# Patient Record
Sex: Male | Born: 1985 | Race: White | Hispanic: No | Marital: Single | Smoking: Current every day smoker
Health system: Southern US, Community
[De-identification: ages and names within clinical notes are randomized; demographics above are authoritative.]

## PROBLEM LIST (undated history)

## (undated) DIAGNOSIS — F99 Mental disorder, not otherwise specified: Secondary | ICD-10-CM

## (undated) DIAGNOSIS — F329 Major depressive disorder, single episode, unspecified: Secondary | ICD-10-CM

## (undated) DIAGNOSIS — F419 Anxiety disorder, unspecified: Secondary | ICD-10-CM

## (undated) DIAGNOSIS — F1910 Other psychoactive substance abuse, uncomplicated: Secondary | ICD-10-CM

## (undated) DIAGNOSIS — F32A Depression, unspecified: Secondary | ICD-10-CM

## (undated) HISTORY — PX: CHOLECYSTECTOMY: SHX55

## (undated) HISTORY — DX: Other psychoactive substance abuse, uncomplicated: F19.10

---

## 2007-05-06 ENCOUNTER — Emergency Department (HOSPITAL_COMMUNITY): Admission: EM | Admit: 2007-05-06 | Discharge: 2007-05-06 | Payer: Self-pay | Admitting: Emergency Medicine

## 2007-07-26 ENCOUNTER — Emergency Department (HOSPITAL_COMMUNITY): Admission: EM | Admit: 2007-07-26 | Discharge: 2007-07-26 | Payer: Self-pay | Admitting: Emergency Medicine

## 2009-06-15 IMAGING — CR DG SHOULDER 2+V*R*
3 series · 3 of 3 positions shown · non-contrast
Comparison: None

CLINICAL DATA: Injured playing softball 2 months ago with pain

RIGHT SHOULDER - 2+ VIEW

[view not recorded (1 of 3)]
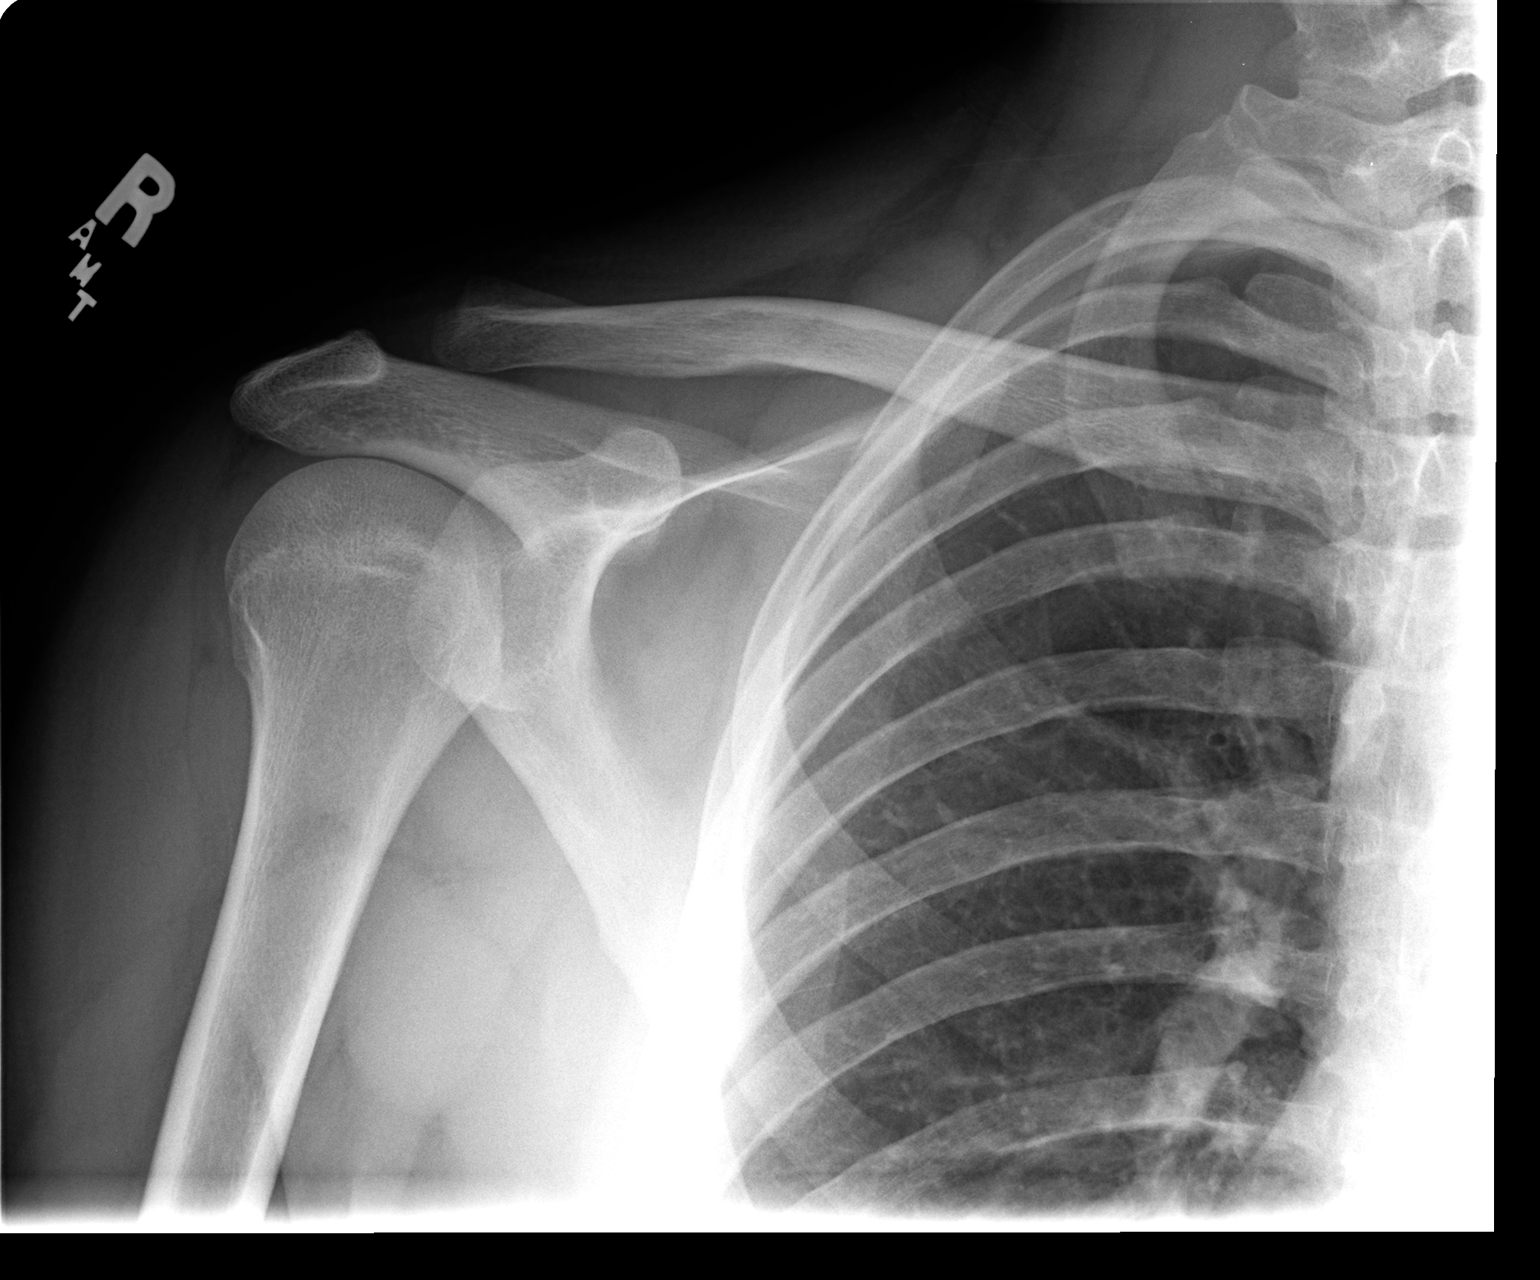

[view not recorded (2 of 3)]
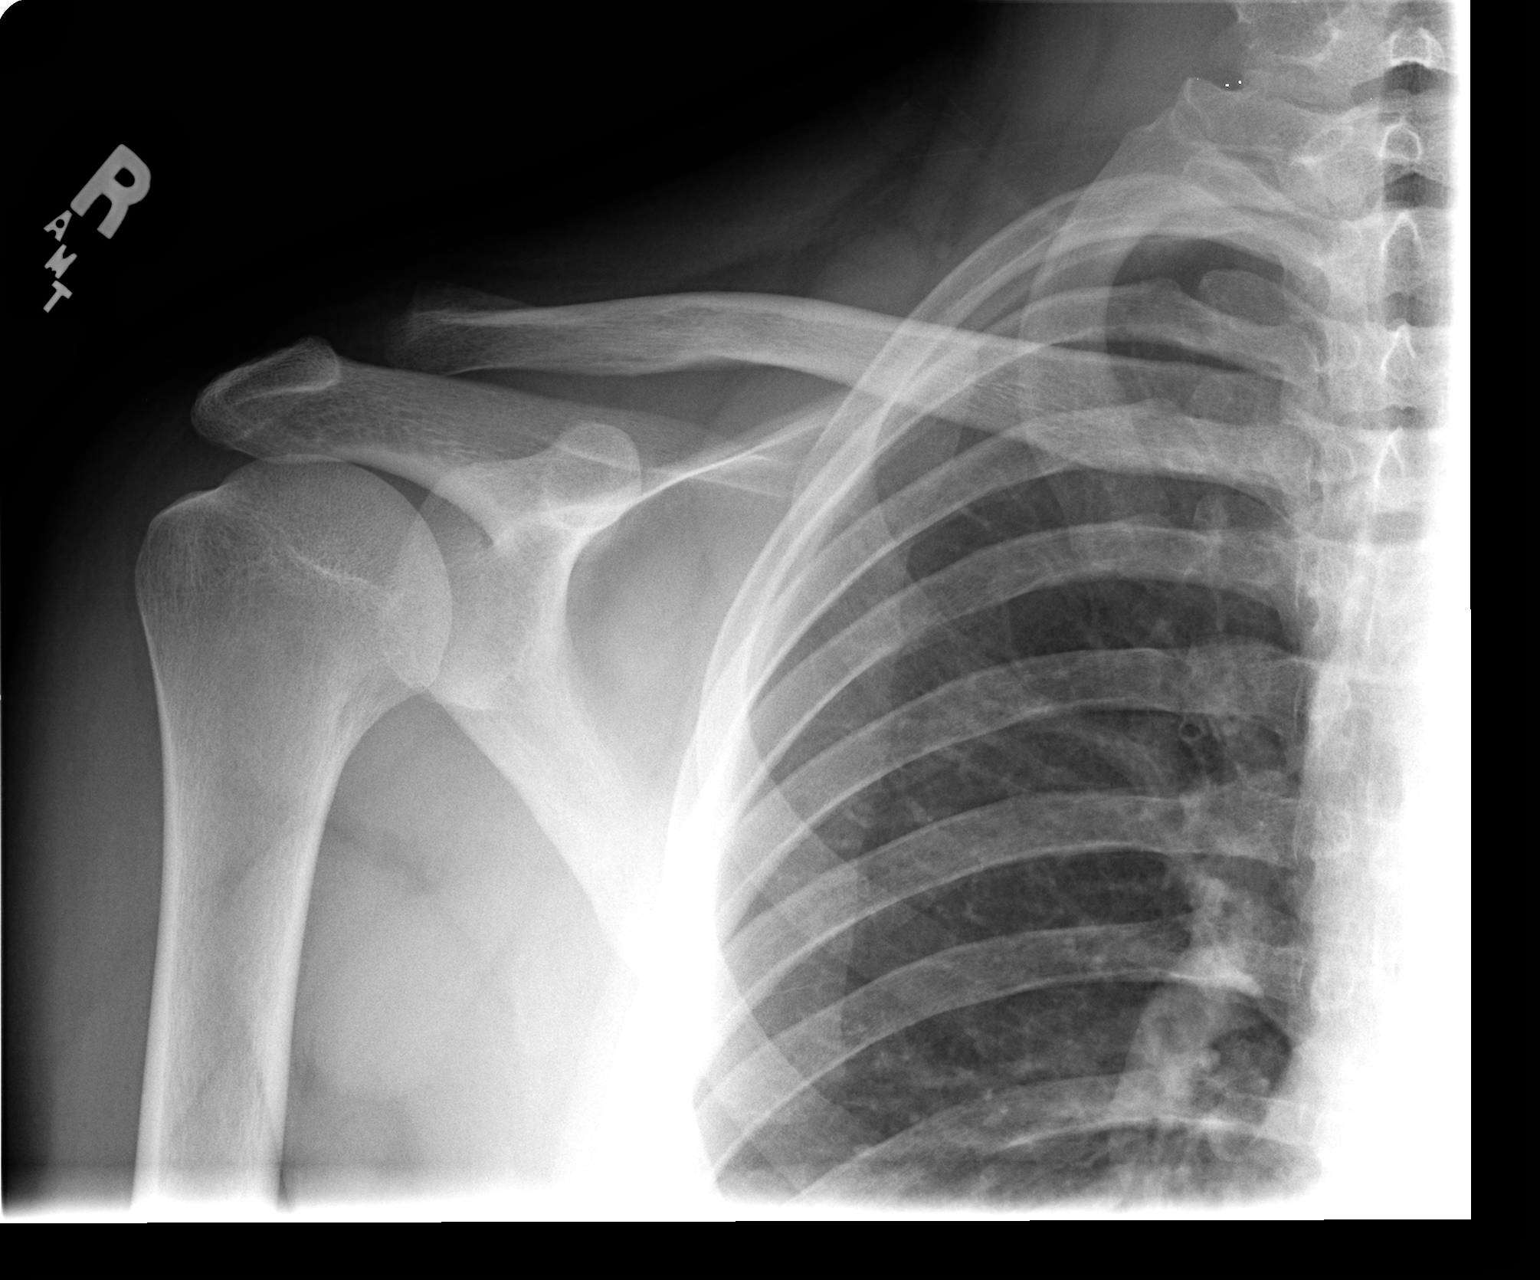

[view not recorded (3 of 3)]
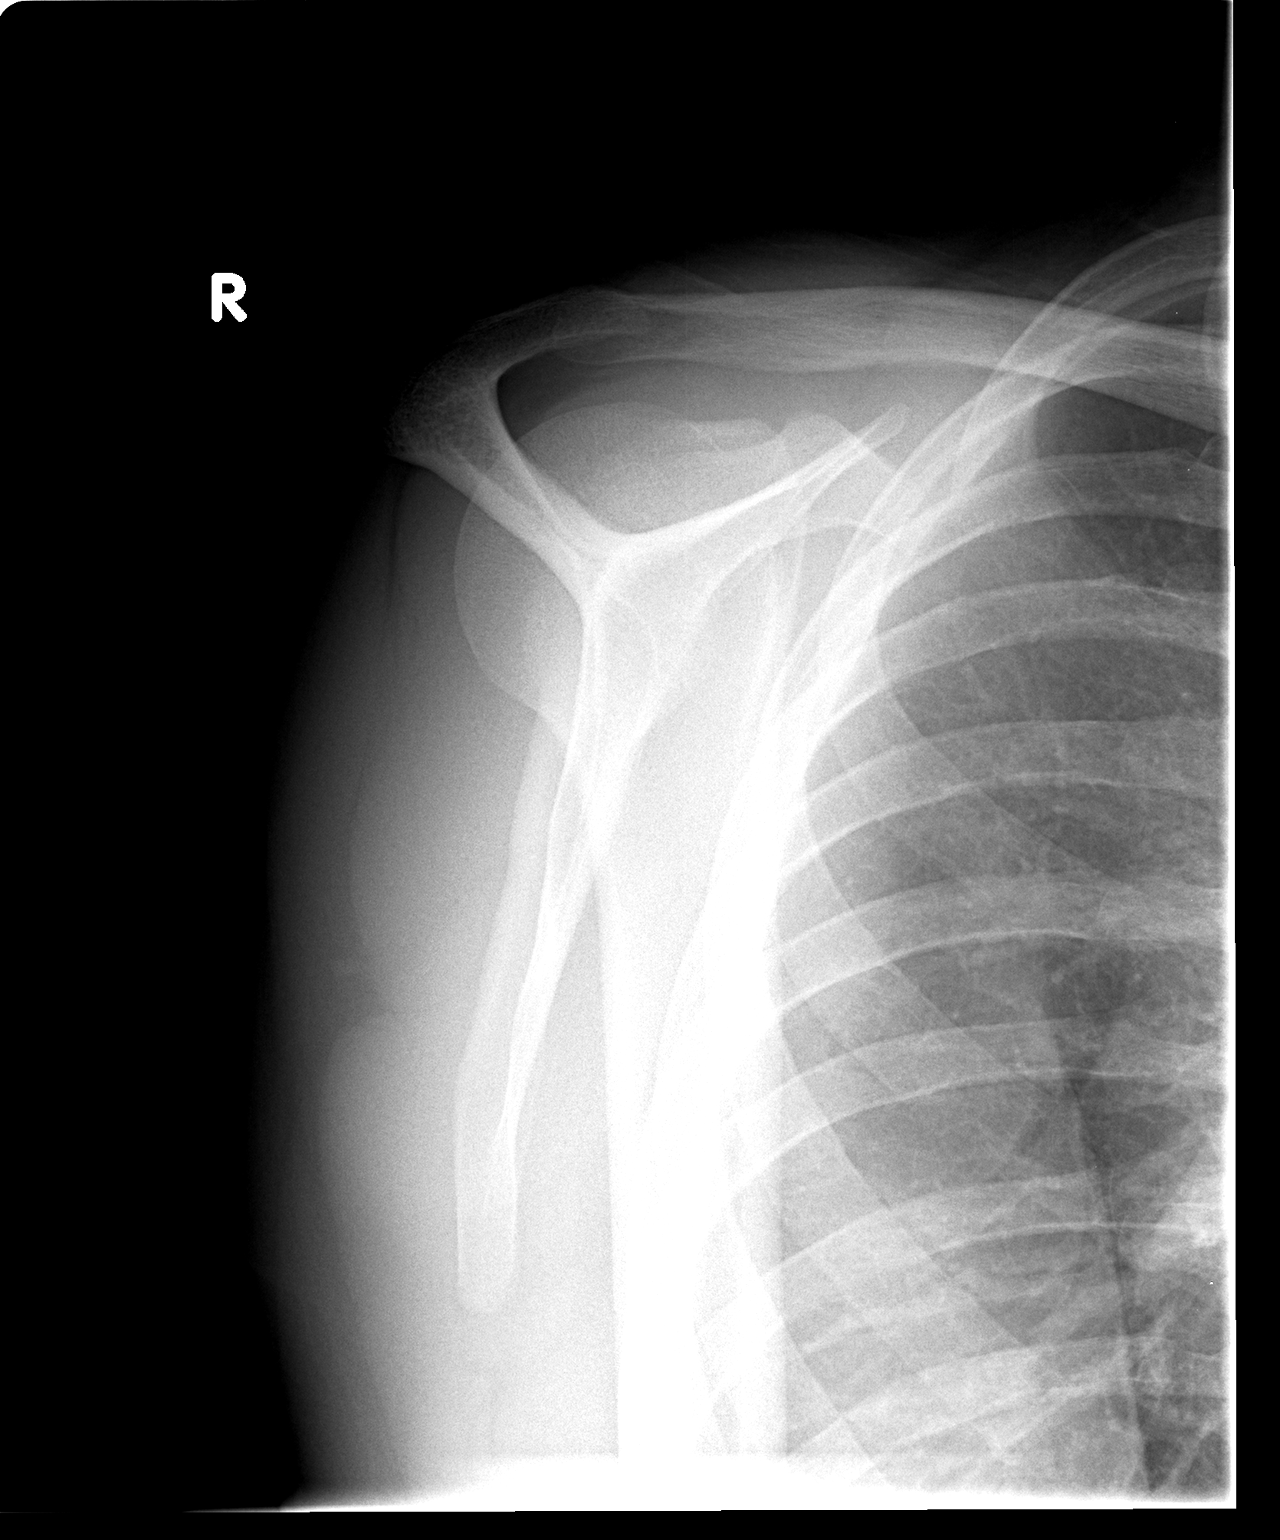

[3 of 3 positions shown; findings below may reference images not displayed]

FINDINGS: The glenohumeral joint space appears normal.  The AC
joint is somewhat widened and AC joint separation cannot be
excluded but alignment is normal.  The ribs appear intact.
IMPRESSION: No acute bony abnormality.  Somewhat widened AC joint.  Cannot
exclude mild AC joint injury.

## 2011-08-01 ENCOUNTER — Emergency Department (HOSPITAL_COMMUNITY)
Admission: EM | Admit: 2011-08-01 | Discharge: 2011-08-01 | Disposition: A | Discharge status: Home / Self Care | Payer: Self-pay | Attending: Emergency Medicine | Admitting: Emergency Medicine

## 2011-08-01 ENCOUNTER — Encounter (HOSPITAL_COMMUNITY): Payer: Self-pay | Admitting: Emergency Medicine

## 2011-08-01 DIAGNOSIS — K029 Dental caries, unspecified: Secondary | ICD-10-CM | POA: Insufficient documentation

## 2011-08-01 DIAGNOSIS — K089 Disorder of teeth and supporting structures, unspecified: Secondary | ICD-10-CM | POA: Insufficient documentation

## 2011-08-01 DIAGNOSIS — K0889 Other specified disorders of teeth and supporting structures: Secondary | ICD-10-CM

## 2011-08-01 MED ORDER — HYDROCODONE-ACETAMINOPHEN 5-325 MG PO TABS: 1.0000 | ORAL_TABLET | Freq: Four times a day (QID) | ORAL | Status: AC | PRN

## 2011-08-01 MED ORDER — PENICILLIN V POTASSIUM 500 MG PO TABS: 500.0000 mg | ORAL_TABLET | Freq: Four times a day (QID) | ORAL | Status: AC

## 2011-08-01 NOTE — Discharge Instructions (Signed)
Dental Pain A tooth ache may be caused by cavities (tooth decay). Cavities expose the nerve of the tooth to air and hot or cold temperatures. It may come from an infection or abscess (also called a boil or furuncle) around your tooth. It is also often caused by dental caries (tooth decay). This causes the pain you are having. DIAGNOSIS  Your caregiver can diagnose this problem by exam. TREATMENT   If caused by an infection, it may be treated with medications which kill germs (antibiotics) and pain medications as prescribed by your caregiver. Take medications as directed.   Only take over-the-counter or prescription medicines for pain, discomfort, or fever as directed by your caregiver.   Whether the tooth ache today is caused by infection or dental disease, you should see your dentist as soon as possible for further care.  SEEK MEDICAL CARE IF: The exam and treatment you received today has been provided on an emergency basis only. This is not a substitute for complete medical or dental care. If your problem worsens or new problems (symptoms) appear, and you are unable to meet with your dentist, call or return to this location. SEEK IMMEDIATE MEDICAL CARE IF:   You have a fever.   You develop redness and swelling of your face, jaw, or neck.   You are unable to open your mouth.   You have severe pain uncontrolled by pain medicine.  MAKE SURE YOU:   Understand these instructions.   Will watch your condition.   Will get help right away if you are not doing well or get worse.  Document Released: 02/28/2005 Document Revised: 02/17/2011 Document Reviewed: 10/17/2007 Exit[employer] Patient Information 2012 [employer].   Take the meds as directed.  Follow up with the dentist of your choice ASAP 

## 2011-08-01 NOTE — ED Provider Notes (Signed)
History     CSN: [id number]  Arrival date & time 08/01/11  1119   First MD Initiated Contact with Patient 08/01/11 1201      Chief Complaint  Patient presents with  . Dental Pain    (Consider location/radiation/quality/duration/timing/severity/associated sxs/prior treatment) HPI Comments: [facility] and was told he could not be seen for ~ 4 months.  Patient is a 26 y.o. male presenting with tooth pain. The history is provided by the patient. No language interpreter was used.  Dental PainThe primary symptoms include mouth pain. Primary symptoms do not include dental injury or fever. Episode onset: several days ago. The symptoms are worsening. The symptoms occur constantly.  Additional symptoms include: jaw pain. Additional symptoms do not include: swollen glands.    History reviewed. No pertinent past medical history.  History reviewed. No pertinent past surgical history.  History reviewed. No pertinent family history.  History  Substance Use Topics  . Smoking status: Current Everyday Smoker  . Smokeless tobacco: Not on file  . Alcohol Use: No      Review of Systems  Constitutional: Negative for fever.  HENT: Positive for dental problem.   Cardiovascular: Negative for chest pain.  All other systems reviewed and are negative.    Allergies  Review of patient's allergies indicates no known allergies.  Home Medications  No current outpatient prescriptions on file.  BP 146/81  Pulse 85  Temp(Src) 98.3 F (36.8 C) (Oral)  Resp 17  SpO2 100%  Physical Exam  Nursing note and vitals reviewed. Constitutional: He is oriented to person, place, and time. He appears well-developed and well-nourished.  HENT:  Head: Normocephalic and atraumatic.  Mouth/Throat: Uvula is midline, oropharynx is clear and moist and mucous membranes are normal. Dental caries present. No uvula swelling.    Eyes: EOM are normal.  Neck: Normal range of motion.    Cardiovascular: Normal rate, regular rhythm, normal heart sounds and intact distal pulses.   Pulmonary/Chest: Effort normal and breath sounds normal. No respiratory distress.  Abdominal: Soft. He exhibits no distension. There is no tenderness.  Musculoskeletal: Normal range of motion.  Neurological: He is alert and oriented to person, place, and time.  Skin: Skin is warm and dry.  Psychiatric: He has a normal mood and affect. Judgment normal.    ED Course  Procedures (including critical care time)  Labs Reviewed - No data to display No results found.   1. Pain, dental       MDM  rx pen VK 500 mg QID, 40 rx hydrocodone, 20 OTC ibuprofen  800 mg TID with food.   F/u with dentist of choice.        Worthy Rancher, PA 08/01/11 1224

## 2011-08-01 NOTE — ED Notes (Signed)
Pt c/o pain to L lower jaw. States feels like it is abscessed. No swelling noted.

## 2011-08-03 NOTE — ED Provider Notes (Signed)
Medical screening examination/treatment/procedure(s) were performed by non-physician practitioner and as supervising physician I was immediately available for consultation/collaboration.  Jubal Rademaker, MD 08/03/11 0910 

## 2012-04-15 ENCOUNTER — Emergency Department (HOSPITAL_COMMUNITY)
Admission: EM | Admit: 2012-04-15 | Discharge: 2012-04-15 | Disposition: A | Discharge status: Home / Self Care | Payer: Self-pay | Attending: Emergency Medicine | Admitting: Emergency Medicine

## 2012-04-15 ENCOUNTER — Encounter (HOSPITAL_COMMUNITY): Payer: Self-pay | Admitting: Emergency Medicine

## 2012-04-15 DIAGNOSIS — K0889 Other specified disorders of teeth and supporting structures: Secondary | ICD-10-CM

## 2012-04-15 DIAGNOSIS — Y939 Activity, unspecified: Secondary | ICD-10-CM | POA: Insufficient documentation

## 2012-04-15 DIAGNOSIS — Y929 Unspecified place or not applicable: Secondary | ICD-10-CM | POA: Insufficient documentation

## 2012-04-15 DIAGNOSIS — K089 Disorder of teeth and supporting structures, unspecified: Secondary | ICD-10-CM | POA: Insufficient documentation

## 2012-04-15 DIAGNOSIS — X58XXXA Exposure to other specified factors, initial encounter: Secondary | ICD-10-CM | POA: Insufficient documentation

## 2012-04-15 DIAGNOSIS — F17200 Nicotine dependence, unspecified, uncomplicated: Secondary | ICD-10-CM | POA: Insufficient documentation

## 2012-04-15 DIAGNOSIS — S025XXA Fracture of tooth (traumatic), initial encounter for closed fracture: Secondary | ICD-10-CM | POA: Insufficient documentation

## 2012-04-15 DIAGNOSIS — R51 Headache: Secondary | ICD-10-CM | POA: Insufficient documentation

## 2012-04-15 MED ORDER — PENICILLIN V POTASSIUM 500 MG PO TABS: 500.0000 mg | ORAL_TABLET | Freq: Four times a day (QID) | ORAL | Status: AC

## 2012-04-15 MED ORDER — OXYCODONE-ACETAMINOPHEN 5-325 MG PO TABS: 2.0000 | ORAL_TABLET | ORAL | Status: DC | PRN

## 2012-04-15 MED ORDER — OXYCODONE-ACETAMINOPHEN 5-325 MG PO TABS
2.0000 | ORAL_TABLET | Freq: Once | ORAL | Status: AC
  Administered 2012-04-15: 2 via ORAL
  Filled 2012-04-15: qty 2

## 2012-04-15 NOTE — ED Provider Notes (Signed)
History  Scribed for Glynn Octave, MD, the patient was seen in room APA03/APA03. This chart was scribed by Candelaria Stagers. The patient's care started at 3:30 PM   CSN: [id number]  Arrival date & time 04/15/12  1519   First MD Initiated Contact with Patient 04/15/12 1527      Chief Complaint  Patient presents with  . Dental Pain     The history is provided by the patient. No language interpreter was used.   Luke Dennis is a 27 y.o. male who presents to the Emergency Department complaining of lower left dental pain after he reports a piece of the tooth broke off yesterday.  He reports he also has a wisdom tooth coming in that is causing pain.  He denies fever.  He is also experiencing a headache.  He has no other health problems.  Pt request a note for work.     History reviewed. No pertinent past medical history.  History reviewed. No pertinent past surgical history.  History reviewed. No pertinent family history.  History  Substance Use Topics  . Smoking status: Current Every Day Smoker    Types: Cigarettes  . Smokeless tobacco: Not on file  . Alcohol Use: No      Review of Systems  Constitutional: Negative for fever and chills.  HENT: Positive for dental problem (left lower dental pain).   Respiratory: Negative for shortness of breath.   Gastrointestinal: Negative for nausea and vomiting.  Neurological: Positive for headaches. Negative for weakness.  All other systems reviewed and are negative.  A complete 10 system review of systems was obtained and all systems are negative except as noted in the HPI and PMH.    Allergies  Haldol and Hydrocodone  Home Medications  No current outpatient prescriptions on file.  BP 143/96  Pulse 89  Temp 97.9 F (36.6 C) (Oral)  Resp 24  Ht 6' (1.829 m)  Wt 220 lb (99.791 kg)  BMI 29.84 kg/m2  SpO2 100%  Physical Exam  Nursing note and vitals reviewed. Constitutional: He is oriented to person, place, and time. He  appears well-developed and well-nourished. No distress.  HENT:  Head: Normocephalic and atraumatic.       Left lower molar chipped.  Floor of mouth soft, no trismus, no tongue elevation, no abscess.  Erupting right upper molar.  Eyes: EOM are normal.  Neck: Neck supple. No tracheal deviation present.  Cardiovascular: Normal rate.   Pulmonary/Chest: Effort normal. No respiratory distress.  Musculoskeletal: Normal range of motion.  Neurological: He is alert and oriented to person, place, and time.  Skin: Skin is warm and dry.  Psychiatric: He has a normal mood and affect. His behavior is normal.    ED Course  Procedures  DIAGNOSTIC STUDIES: Oxygen Saturation is 100% on room air, normal by my interpretation.    COORDINATION OF CARE:  3:45PM Ordered: oxycodone-acetaminophen (PERCOCET/ROXICET) 5-325 MG per tablet 2 tablet   Labs Reviewed - No data to display No results found.   No diagnosis found.    MDM  Left lower molar dental pain fractured tooth. No evidence of abscess or Ludwig angina.  No difficulty breathing or swallowing. Pain control, followup with dentistry. [place] narcotic database reviewed   I personally performed the services described in this documentation, which was scribed in my presence. The recorded information has been reviewed and is accurate.        Glynn Octave, MD 04/15/12 2232

## 2012-04-15 NOTE — ED Notes (Signed)
Pt c/o lower left toothache. 

## 2012-04-22 ENCOUNTER — Encounter (HOSPITAL_COMMUNITY): Payer: Self-pay

## 2012-04-22 ENCOUNTER — Emergency Department (HOSPITAL_COMMUNITY)
Admission: EM | Admit: 2012-04-22 | Discharge: 2012-04-22 | Disposition: A | Discharge status: Home / Self Care | Payer: Self-pay | Attending: Emergency Medicine | Admitting: Emergency Medicine

## 2012-04-22 DIAGNOSIS — K089 Disorder of teeth and supporting structures, unspecified: Secondary | ICD-10-CM | POA: Insufficient documentation

## 2012-04-22 DIAGNOSIS — F17200 Nicotine dependence, unspecified, uncomplicated: Secondary | ICD-10-CM | POA: Insufficient documentation

## 2012-04-22 DIAGNOSIS — K029 Dental caries, unspecified: Secondary | ICD-10-CM | POA: Insufficient documentation

## 2012-04-22 DIAGNOSIS — K0889 Other specified disorders of teeth and supporting structures: Secondary | ICD-10-CM

## 2012-04-22 MED ORDER — TRAMADOL HCL 50 MG PO TABS: 50.0000 mg | ORAL_TABLET | Freq: Four times a day (QID) | ORAL | Status: DC | PRN

## 2012-04-22 MED ORDER — NAPROXEN 250 MG PO TABS: 250.0000 mg | ORAL_TABLET | Freq: Two times a day (BID) | ORAL | Status: DC

## 2012-04-22 NOTE — ED Notes (Signed)
Pt with multiple toothaches.

## 2012-04-22 NOTE — ED Provider Notes (Signed)
History     CSN: [id number]  Arrival date & time 04/22/12  1343   First MD Initiated Contact with Patient 04/22/12 1402      Chief Complaint  Patient presents with  . Dental Pain     HPI Pt was seen at 1415.  Per pt, c/o gradual onset and persistence of constant left lower tooth and right upper molar "pain" for the past several months.  Denies fevers, no intra-oral edema, no rash, no facial swelling, no dysphagia, no neck pain.   The condition is aggravated by nothing. The condition is relieved by nothing. The symptoms have been associated with no other complaints. The patient has no significant history of serious medical conditions. The patient has a significant history of similar symptoms previously, recently being evaluated for this complaint and multiple prior evals for same.       History reviewed. No pertinent past medical history.  History reviewed. No pertinent past surgical history.   History  Substance Use Topics  . Smoking status: Current Every Day Smoker    Types: Cigarettes  . Smokeless tobacco: Not on file  . Alcohol Use: No      Review of Systems ROS: Statement: All systems negative except as marked or noted in the HPI; Constitutional: Negative for fever and chills. ; ; Eyes: Negative for eye pain and discharge. ; ; ENMT: Positive for dental caries, dental hygiene poor and toothache. Negative for ear pain, bleeding gums, dental injury, facial deformity, facial swelling, hoarseness, nasal congestion, sinus pressure, sore throat, throat swelling and tongue swollen. ; ; Cardiovascular: Negative for chest pain, palpitations, diaphoresis, dyspnea and peripheral edema. ; ; Respiratory: Negative for cough, wheezing and stridor. ; ; Gastrointestinal: Negative for nausea, vomiting, diarrhea and abdominal pain. ; ; Genitourinary: Negative for dysuria, flank pain and hematuria. ; ; Musculoskeletal: Negative for back pain and neck pain. ; ; Skin: Negative for rash and skin  lesion. ; ; Neuro: Negative for headache, lightheadedness and neck stiffness. ;    Allergies  Haldol and Hydrocodone  Home Medications   Current Outpatient Rx  Name  Route  Sig  Dispense  Refill  . oxyCODONE-acetaminophen (PERCOCET/ROXICET) 5-325 MG per tablet   Oral   Take 2 tablets by mouth every 4 (four) hours as needed for pain.   15 tablet   0   . penicillin v potassium (VEETID) 500 MG tablet   Oral   Take 1 tablet (500 mg total) by mouth 4 (four) times daily.   40 tablet   0   . naproxen (NAPROSYN) 250 MG tablet   Oral   Take 1 tablet (250 mg total) by mouth 2 (two) times daily with a meal.   14 tablet   0   . traMADol (ULTRAM) 50 MG tablet   Oral   Take 1 tablet (50 mg total) by mouth every 6 (six) hours as needed for pain.   15 tablet   0     BP 131/83  Pulse 100  Temp(Src) 97.9 F (36.6 C) (Oral)  Resp 20  SpO2 100%  Physical Exam 1420: Physical examination: Vital signs and O2 SAT: Reviewed; Constitutional: Well developed, Well nourished, Well hydrated, In no acute distress; Head and Face: Normocephalic, Atraumatic; Eyes: EOMI, PERRL, No scleral icterus; ENMT: Mouth and pharynx normal, Poor dentition, Widespread dental decay, Left TM normal, Right TM normal, Mucous membranes moist, +upper right wisdom tooth erupting, left lower molar with dental decay.  No gingival erythema, edema, fluctuance, or  drainage.  No intra-oral edema. No hoarse voice, no drooling, no stridor.  ; Neck: Supple, Full range of motion, No lymphadenopathy; Cardiovascular: Regular rate and rhythm, No murmur, rub, or gallop; Respiratory: Breath sounds clear & equal bilaterally, No rales, rhonchi, wheezes, or rub, Normal respiratory effort/excursion; Chest: Nontender, Movement normal; Extremities: Pulses normal, No tenderness, No edema; Neuro: AA&Ox3, Major CN grossly intact.  No gross focal motor or sensory deficits in extremities.; Skin: Color normal, No rash, No petechiae, Warm, Dry   ED  Course  Procedures     MDM  MDM Reviewed: nursing note, vitals and previous chart     1430:  Pt seen in ED 1 week ago for same, has not called a dentist for f/u.  Will not rx more narcotic pain meds, as pt has been eval in the ED 3 times for the same complaints (same teeth) over the past 9 months.  Pt encouraged to f/u with dentist or oral surgeon for his dental needs for good continuity of care and definitive treatment.  Verb understanding.        Laray Anger, DO 04/23/12 2109

## 2012-07-04 NOTE — BH Assessment (Signed)
Assessment Note   Luke Dennis is an 27 y.o. male presented to [facility] requesting detox for opioids and benzodiazepines.  Pt made threats in the ED, "if you send me home, I will kill myself.  Pt had no specific plan.  However, over the course of 3 years, he has had 6 SI attempts.  Pt reports abusing marijuana and cocaine on a daily basis.  Pt. current triggers to his usage and Suicidal ideations are due to some social struggles as as evidence by  job lost and wife is currently pregnant.  Pt denies HI and A/V hallucinations.  Pt endorses that he has been using opioids, cocaine, benzodiazepines for 4 months and marijuana for the past two years.  Pt admits that this longest period of sobriety is 8 months. Pt also reports an increase in signs and symptoms of depression to include:  Hopelessness, crying spells and mood swings, fatigue, decrease concentration, social withdrawal and feelings of guilt.  While in the ED([facility]) @12 :22 his CIWA was 1 and his COW was at a 5.    He has a supportive wife and mother.   Axis I: Opioid and Benzodiazepine dependence, Cocaine and Cannibis Abuse, Mood Disorder NOS Axis II: Deferred Axis III: No past medical history on file. Axis IV: economic problems, other psychosocial or environmental problems, problems related to legal system/crime and problems related to social environment Axis V: 31-40 impairment in reality testing  Past Medical History: No past medical history on file.  No past surgical history on file.  Family History: No family history on file.  Social History:  reports that he has been smoking Cigarettes.  He has been smoking about 0.00 packs per day. He does not have any smokeless tobacco history on file. He reports that he does not drink alcohol or use illicit drugs.  Additional Social History:  Alcohol / Drug Use History of alcohol / drug use?: Yes Substance #1 Name of Substance 1: cocaine/Crack 1 - Age of First Use: 22 1 - Amount  (size/oz): <1gm 1 - Frequency: 2-3 times per week 1 - Duration: 4 months 1 - Last Use / Amount: 07/02/2012 Substance #2 Name of Substance 2: opiates(Vicodin, percocet and opana) 2 - Age of First Use: 22 2 - Frequency: weekly 2 - Duration: 4 months 2 - Last Use / Amount: 07/04/2012 Substance #3 Name of Substance 3: Benzodiazepines 3 - Age of First Use: 22 3 - Amount (size/oz): up to 30 mg 3 - Frequency: daily 3 - Duration: 4 months 3 - Last Use / Amount: 07/04/2012 Substance #4 Name of Substance 4: marijuana 4 - Age of First Use: teenager 4 - Amount (size/oz): 3-4 joints 4 - Frequency: daily 4 - Duration: 2 years 4 - Last Use / Amount: 07/04/2012  CIWA: CIWA-Ar BP: 106/53 mmHg Pulse Rate: 85 Nausea and Vomiting: mild nausea with no vomiting Tactile Disturbances: none Tremor: no tremor Auditory Disturbances: not present Paroxysmal Sweats: no sweat visible Visual Disturbances: not present Anxiety: no anxiety, at ease Headache, Fullness in Head: none present Agitation: normal activity Orientation and Clouding of Sensorium: oriented and can do serial additions CIWA-Ar Total: 1 COWS: Clinical Opiate Withdrawal Scale (COWS) Resting Pulse Rate: Pulse Rate 81-100 Sweating: No report of chills or flushing Restlessness: Able to sit still Pupil Size: Pupils possibly larger than normal for room light Bone or Joint Aches: Mild diffuse discomfort Runny Nose or Tearing: Not present GI Upset: nausea or loose stool Tremor: No tremor Yawning: No yawning  Anxiety or Irritability: None Gooseflesh Skin: Skin is smooth COWS Total Score: 5  Allergies:  Allergies  Allergen Reactions  . Haldol (Haloperidol Lactate) Other (See Comments)    Child hood allergy  . Hydrocodone Nausea And Vomiting    Home Medications:  (Not in a hospital admission)  OB/GYN Status:  No LMP for male patient.  General Assessment Data Location of Assessment: [facility] Living Arrangements:  Spouse/significant other Can pt return to current living arrangement?: Yes Admission Status: Involuntary Is patient capable of signing voluntary admission?: No Transfer from: Acute Hospital Referral Source: Psychiatrist     Risk to self Suicidal Ideation: Yes-Currently Present Suicidal Intent: Yes-Currently Present Is patient at risk for suicide?: Yes Suicidal Plan?: No-Not Currently/Within Last 6 Months Access to Means: No What has been your use of drugs/alcohol within the last 12 months?: opioid, benzodiazepine, cannibis and cocaine Previous Attempts/Gestures: Yes How many times?:  (6 times, 2010(2 times), 2011(2 times) and 2012(2 times)) Other Self Harm Risks: no Triggers for Past Attempts: Unknown Family Suicide History: No Recent stressful life event(s): Job Loss;Financial Problems;Legal Issues;Other (Comment) (wife is pregnant) Persecutory voices/beliefs?: No Depression: Yes Depression Symptoms: Insomnia;Tearfulness;Isolating;Fatigue;Guilt;Loss of interest in usual pleasures;Feeling worthless/self pity;Feeling angry/irritable Substance abuse history and/or treatment for substance abuse?: Yes Suicide prevention information given to non-admitted patients: Not applicable  Risk to Others Homicidal Ideation: No Thoughts of Harm to Others: No Current Homicidal Intent: No-Not Currently/Within Last 6 Months History of harm to others?: No Assessment of Violence: None Noted Criminal Charges Pending?: Yes (multiple arrest and convictions for drug related charges) Describe Pending Criminal Charges:  (no pending court date given at this ) Does patient have a court date: No  Psychosis Hallucinations: None noted Delusions: None noted  Mental Status Report Appear/Hygiene: Disheveled Eye Contact: Fair Motor Activity: Other (Comment) (decrease) Speech: Soft Level of Consciousness: Sedated;Other (Comment) (withdrawn and sedated) Mood: Irritable Affect:  (dull) Anxiety Level:  Minimal Thought Processes: Coherent;Relevant Judgement: Impaired Orientation: Person;Place;Time;Situation Obsessive Compulsive Thoughts/Behaviors: Moderate (Recurrent thoughts of death)  Cognitive Functioning Concentration: Decreased Memory: Recent Impaired IQ: Average (Poor historian) Insight: Poor Impulse Control: Poor Appetite: Fair Weight Loss:  (none reported) Weight Gain:  (none reported) Sleep: Decreased Total Hours of Sleep: 4 Vegetative Symptoms: Decreased grooming  ADLScreening [facility]) Patient's cognitive ability adequate to safely complete daily activities?: Yes Patient able to express need for assistance with ADLs?: Yes Independently performs ADLs?: Yes (appropriate for developmental age)  Abuse/Neglect [facility]) Physical Abuse: Denies Verbal Abuse: Denies Sexual Abuse: Denies  Prior Inpatient Therapy Prior Inpatient Therapy: Yes Prior Therapy Dates: December 2013, June 2013, December 2012 and October 2012 Prior Therapy Facilty/Provider(s): [place], [facility], [facility] Reason for Treatment: [facility], Chemical Dependency  Prior Outpatient Therapy Prior Outpatient Therapy: Yes Prior Therapy Dates: 2013, 2012 Prior Therapy Facilty/Provider(s): [place], [facility] Reason for Treatment: Chemical dependency  ADL Screening (condition at time of admission) Patient's cognitive ability adequate to safely complete daily activities?: Yes Patient able to express need for assistance with ADLs?: Yes Independently performs ADLs?: Yes (appropriate for developmental age)       Abuse/Neglect Assessment (Assessment to be complete while patient is alone) Physical Abuse: Denies Verbal Abuse: Denies Sexual Abuse: Denies          Additional Information 1:1 In Past 12 Months?: No CIRT Risk: No Elopement Risk: No Does patient have medical clearance?: Yes     Disposition: Pt admitted to [facility] by Fransisca Kaufmann, NP to [place], [address] Disposition Initial Assessment  Completed  for this Encounter: Yes Disposition of Patient:  (Pt.was accepted to [facility] by Vernona Rieger NP to [place], [address])  On Site Evaluation by:   Reviewed with Physician:     Tennis Must 07/04/2012 10:52 PM

## 2012-07-05 ENCOUNTER — Inpatient Hospital Stay (HOSPITAL_COMMUNITY)
Admission: AD | Admit: 2012-07-05 | Discharge: 2012-07-12 | DRG: 897 | Disposition: A | Discharge status: Home / Self Care | Payer: Federal, State, Local not specified - Other | Source: Other Acute Inpatient Hospital | Attending: Psychiatry | Admitting: Psychiatry

## 2012-07-05 ENCOUNTER — Encounter (HOSPITAL_COMMUNITY): Payer: Self-pay | Admitting: Behavioral Health

## 2012-07-05 DIAGNOSIS — R109 Unspecified abdominal pain: Secondary | ICD-10-CM

## 2012-07-05 DIAGNOSIS — F319 Bipolar disorder, unspecified: Secondary | ICD-10-CM

## 2012-07-05 DIAGNOSIS — F1920 Other psychoactive substance dependence, uncomplicated: Secondary | ICD-10-CM | POA: Diagnosis present

## 2012-07-05 DIAGNOSIS — F1994 Other psychoactive substance use, unspecified with psychoactive substance-induced mood disorder: Principal | ICD-10-CM | POA: Diagnosis present

## 2012-07-05 DIAGNOSIS — Z79899 Other long term (current) drug therapy: Secondary | ICD-10-CM

## 2012-07-05 DIAGNOSIS — F1123 Opioid dependence with withdrawal: Secondary | ICD-10-CM

## 2012-07-05 HISTORY — DX: Anxiety disorder, unspecified: F41.9

## 2012-07-05 HISTORY — DX: Mental disorder, not otherwise specified: F99

## 2012-07-05 HISTORY — DX: Major depressive disorder, single episode, unspecified: F32.9

## 2012-07-05 HISTORY — DX: Depression, unspecified: F32.A

## 2012-07-05 MED ORDER — CLONIDINE HCL 0.1 MG PO TABS
0.1000 mg | ORAL_TABLET | Freq: Every day | ORAL | Status: AC
  Administered 2012-07-09 – 2012-07-10 (×2): 0.1 mg via ORAL
  Filled 2012-07-05 (×2): qty 1

## 2012-07-05 MED ORDER — CHLORDIAZEPOXIDE HCL 25 MG PO CAPS
25.0000 mg | ORAL_CAPSULE | Freq: Four times a day (QID) | ORAL | Status: AC
  Administered 2012-07-05 (×2): 25 mg via ORAL
  Filled 2012-07-05 (×3): qty 1

## 2012-07-05 MED ORDER — CHLORDIAZEPOXIDE HCL 25 MG PO CAPS
50.0000 mg | ORAL_CAPSULE | Freq: Once | ORAL | Status: AC
  Administered 2012-07-05: 50 mg via ORAL
  Filled 2012-07-05: qty 2

## 2012-07-05 MED ORDER — CHLORDIAZEPOXIDE HCL 25 MG PO CAPS
25.0000 mg | ORAL_CAPSULE | Freq: Three times a day (TID) | ORAL | Status: AC
  Administered 2012-07-06 (×3): 25 mg via ORAL
  Filled 2012-07-05 (×3): qty 1

## 2012-07-05 MED ORDER — NICOTINE 21 MG/24HR TD PT24
21.0000 mg | MEDICATED_PATCH | Freq: Every day | TRANSDERMAL | Status: DC
  Administered 2012-07-05 – 2012-07-12 (×8): 21 mg via TRANSDERMAL
  Filled 2012-07-05 (×11): qty 1

## 2012-07-05 MED ORDER — SULFAMETHOXAZOLE-TRIMETHOPRIM 400-80 MG PO TABS
1.0000 | ORAL_TABLET | Freq: Two times a day (BID) | ORAL | Status: DC
  Administered 2012-07-05 – 2012-07-06 (×3): 1 via ORAL
  Administered 2012-07-06: 22:00:00 via ORAL
  Administered 2012-07-07 – 2012-07-12 (×10): 1 via ORAL
  Filled 2012-07-05 (×20): qty 1

## 2012-07-05 MED ORDER — ONDANSETRON 4 MG PO TBDP
4.0000 mg | ORAL_TABLET | Freq: Four times a day (QID) | ORAL | Status: AC | PRN
  Administered 2012-07-05 – 2012-07-07 (×4): 4 mg via ORAL

## 2012-07-05 MED ORDER — TRAZODONE HCL 100 MG PO TABS
100.0000 mg | ORAL_TABLET | Freq: Every evening | ORAL | Status: DC | PRN
  Administered 2012-07-05 – 2012-07-06 (×2): 100 mg via ORAL
  Filled 2012-07-05 (×2): qty 1

## 2012-07-05 MED ORDER — CHLORDIAZEPOXIDE HCL 25 MG PO CAPS
25.0000 mg | ORAL_CAPSULE | Freq: Every day | ORAL | Status: AC
  Administered 2012-07-08: 25 mg via ORAL
  Filled 2012-07-05: qty 1

## 2012-07-05 MED ORDER — LOPERAMIDE HCL 2 MG PO CAPS: 2.0000 mg | ORAL_CAPSULE | ORAL | Status: AC | PRN

## 2012-07-05 MED ORDER — CHLORDIAZEPOXIDE HCL 25 MG PO CAPS
25.0000 mg | ORAL_CAPSULE | Freq: Four times a day (QID) | ORAL | Status: AC | PRN
  Administered 2012-07-06 – 2012-07-08 (×6): 25 mg via ORAL
  Filled 2012-07-05 (×6): qty 1

## 2012-07-05 MED ORDER — HYDROXYZINE HCL 25 MG PO TABS
25.0000 mg | ORAL_TABLET | Freq: Four times a day (QID) | ORAL | Status: AC | PRN
  Administered 2012-07-05: 25 mg via ORAL

## 2012-07-05 MED ORDER — ADULT MULTIVITAMIN W/MINERALS CH
1.0000 | ORAL_TABLET | Freq: Every day | ORAL | Status: DC
  Administered 2012-07-05 – 2012-07-12 (×8): 1 via ORAL
  Filled 2012-07-05 (×10): qty 1

## 2012-07-05 MED ORDER — METHOCARBAMOL 500 MG PO TABS
500.0000 mg | ORAL_TABLET | Freq: Three times a day (TID) | ORAL | Status: DC | PRN
  Administered 2012-07-05 – 2012-07-09 (×4): 500 mg via ORAL
  Filled 2012-07-05 (×4): qty 1

## 2012-07-05 MED ORDER — HYDROXYZINE HCL 25 MG PO TABS: 25.0000 mg | ORAL_TABLET | Freq: Four times a day (QID) | ORAL | Status: DC | PRN

## 2012-07-05 MED ORDER — ONDANSETRON 4 MG PO TBDP: 4.0000 mg | ORAL_TABLET | Freq: Four times a day (QID) | ORAL | Status: DC | PRN

## 2012-07-05 MED ORDER — THIAMINE HCL 100 MG/ML IJ SOLN: 100.0000 mg | Freq: Once | INTRAMUSCULAR | Status: DC

## 2012-07-05 MED ORDER — CLONIDINE HCL 0.1 MG PO TABS
0.1000 mg | ORAL_TABLET | ORAL | Status: AC
  Administered 2012-07-07 – 2012-07-08 (×4): 0.1 mg via ORAL
  Filled 2012-07-05 (×4): qty 1

## 2012-07-05 MED ORDER — CHLORDIAZEPOXIDE HCL 25 MG PO CAPS
25.0000 mg | ORAL_CAPSULE | ORAL | Status: AC
  Administered 2012-07-07 (×2): 25 mg via ORAL
  Filled 2012-07-05 (×2): qty 1

## 2012-07-05 MED ORDER — VITAMIN B-1 100 MG PO TABS
100.0000 mg | ORAL_TABLET | Freq: Every day | ORAL | Status: DC
  Administered 2012-07-06 – 2012-07-12 (×7): 100 mg via ORAL
  Filled 2012-07-05 (×9): qty 1

## 2012-07-05 MED ORDER — DICYCLOMINE HCL 20 MG PO TABS
20.0000 mg | ORAL_TABLET | Freq: Four times a day (QID) | ORAL | Status: AC | PRN
  Administered 2012-07-05 – 2012-07-08 (×7): 20 mg via ORAL
  Filled 2012-07-05 (×7): qty 1

## 2012-07-05 MED ORDER — ALUM & MAG HYDROXIDE-SIMETH 200-200-20 MG/5ML PO SUSP: 30.0000 mL | ORAL | Status: DC | PRN

## 2012-07-05 MED ORDER — ACETAMINOPHEN 325 MG PO TABS
650.0000 mg | ORAL_TABLET | Freq: Four times a day (QID) | ORAL | Status: DC | PRN
Start: 2012-07-05 — End: 2012-07-12
  Administered 2012-07-06 – 2012-07-12 (×6): 650 mg via ORAL

## 2012-07-05 MED ORDER — LOPERAMIDE HCL 2 MG PO CAPS: 2.0000 mg | ORAL_CAPSULE | ORAL | Status: DC | PRN

## 2012-07-05 MED ORDER — MAGNESIUM HYDROXIDE 400 MG/5ML PO SUSP
30.0000 mL | Freq: Every day | ORAL | Status: DC | PRN
  Administered 2012-07-08: 30 mL via ORAL

## 2012-07-05 MED ORDER — CLONIDINE HCL 0.1 MG PO TABS
0.1000 mg | ORAL_TABLET | Freq: Four times a day (QID) | ORAL | Status: AC
  Administered 2012-07-05 – 2012-07-06 (×6): 0.1 mg via ORAL
  Filled 2012-07-05 (×7): qty 1

## 2012-07-05 MED ORDER — NAPROXEN 500 MG PO TABS
500.0000 mg | ORAL_TABLET | Freq: Two times a day (BID) | ORAL | Status: AC | PRN
  Administered 2012-07-05 – 2012-07-10 (×10): 500 mg via ORAL
  Filled 2012-07-05 (×10): qty 1

## 2012-07-05 NOTE — Tx Team (Signed)
Initial Interdisciplinary Treatment Plan  PATIENT STRENGTHS: (choose at least two) Ability for insight Capable of independent living Communication skills General fund of knowledge Motivation for treatment/growth  PATIENT STRESSORS: Financial difficulties Marital or family conflict Substance abuse   PROBLEM LIST: Problem List/Patient Goals Date to be addressed Date deferred Reason deferred Estimated date of resolution  Substance abuse      Depression      Anxiety                                           DISCHARGE CRITERIA:  Ability to meet basic life and health needs Improved stabilization in mood, thinking, and/or behavior Motivation to continue treatment in a less acute level of care  PRELIMINARY DISCHARGE PLAN: Attend aftercare/continuing care group Outpatient therapy Return to previous living arrangement  PATIENT/FAMIILY INVOLVEMENT: This treatment plan has been presented to and reviewed with the patient, Luke Dennis.  The patient and family have been given the opportunity to ask questions and make suggestions.  Harold Barban E 07/05/2012, 10:45 AM

## 2012-07-05 NOTE — Progress Notes (Signed)
[facility] LCSW Group Therapy  07/05/2012 1:15 PM  Type of Therapy:  Group Therapy 1:15 to 2:30   Participation Level:  Active, for the 15 minutes patient was in group  Participation Quality:  Attentive and Sharing  Affect:  Flat  Cognitive:  Alert and Oriented  Insight:  Limited  Engagement in Therapy:  Developing  Modes of Intervention:  Discussion, Exploration, Rapport Building, Socialization and Support  Summary of Progress/Problems: Focus of group processing discussion was on balance in life; the components in life which have a negative influence on balance and the components which make for a more balanced life.  Patient shared several negative influences in his life including: altercations with others, drugs, negative people and ability to work while under the influence.  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    Luke Dennis

## 2012-07-05 NOTE — [facility] Suicide Risk Assessment (Signed)
Suicide Risk Assessment  Admission Assessment     Nursing information obtained from:  Patient Demographic factors:  Male;Caucasian Current Mental Status:  NA Loss Factors:  Loss of significant relationship;Financial problems / change in socioeconomic status Historical Factors:  NA Risk Reduction Factors:  Positive social support  CLINICAL FACTORS:   Severe Anxiety and/or Agitation Depression:   Comorbid alcohol abuse/dependence Alcohol/Substance Abuse/Dependencies  COGNITIVE FEATURES THAT CONTRIBUTE TO RISK:  Closed-mindedness Thought constriction (tunnel vision)    SUICIDE RISK:   Moderate:  Frequent suicidal ideation with limited intensity, and duration, some specificity in terms of plans, no associated intent, good self-control, limited dysphoria/symptomatology, some risk factors present, and identifiable protective factors, including available and accessible social support.  PLAN OF CARE: Supportive approach/coping skills/relapse prevention                               Reassess and address the co morbidities  I certify that inpatient services furnished can reasonably be expected to improve the patient's condition.  Deleah Tison A 07/05/2012, 3:34 PM

## 2012-07-05 NOTE — Progress Notes (Signed)
Admission Note  D: Patient appropriate and cooperative with staff. Patient verbalized that he's here for detox and depression. He stated "I was clean for about eight months and recently relapsed". Patient verbalized that he's been abusing benzo's, opiates, smoking marijuana and cocaine and drinking alcohol; he stated that on yesterday he took "45 Xanax, 15 at 7am and 30 between 8:30 am - 9:30 am and some pain medication right after, hydrocodone 5/325." Stressors are life itself, stating " I just don't know to cope like other people", "I have been a drug addict since I was 15." Patient verbalized that he also has a fiancee' that's pregnant with his first son, but she recently moved back to [place], [place]. He stated that "him and the fiancee' moved to [place] after he completed a treatment program([facility]) in [place], [place]." Patient verbalized that he does have support from his mom and sister.  A: Support and encouragement provided to patient. Oriented patient to the unit and informed of the unit rules/policies. Initiated Q15 minute checks for safety.  R: Patient receptive. Denies SI/HI/AVH. Patient remains safe.

## 2012-07-05 NOTE — Progress Notes (Signed)
D  Pt said he is starting to feel some withdrawal symptoms  Mainly aches and anxiety   He attends group and participates  He interacts well with others  He is expressing desire to get symbaxen to help ease the withdrawal symptoms A   Verbal support given  Dressing change to back wound where area shows no signs of infection  Pt does complain of area being painful and it has a small amount of serous drainage   Medications administered and effectivenes monitored  Q 15 min checks R   Pt safe at present

## 2012-07-05 NOTE — Progress Notes (Signed)
Recreation Therapy Notes  Date: 04.24.2014 Time: 3:05pm Location: [facility]      Group Topic/Focus: Goal Setting, Team Building  Participation Level: Active  Participation Quality: Appropriate  Affect: Euthymic  Cognitive: Appropriate  Additional Comments: Activity: Space Ball & Chain Link ; Explanation: [employer] - Patients were asked to set a goal for how far the could walk while holding a beach ball between their hips. Patients were instructed not to touch each other or the beach ball. Chain Link - Patients in teams of three were asked to set a goal for how many paperclips they could link together. Patients were required to work as a team. Individually patients could only use one hand. Patients were given approximately 10 minutes to complete this task.  Patient with peer set a goal of walking approximately 25 feet while holding the beach ball between their hips. Patient with peer successful at reaching this goal. Patient with peers set goal of creating a chain with 25 paper clips. Patient with peers successful at reaching this goal. Patient with peers linked 30 paper clips together. Patient participated in wrap up discussion about using goal setting and teamwork in a positive way. Patient stated this activity taught him about using communication to build a support system.   Luke Dennis, LRT/CTRS  Alasdair Kleve L 07/05/2012 4:10 PM
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
## 2012-07-06 MED ORDER — GABAPENTIN 300 MG PO CAPS
300.0000 mg | ORAL_CAPSULE | Freq: Once | ORAL | Status: AC
  Administered 2012-07-06: 300 mg via ORAL
  Filled 2012-07-06 (×2): qty 1

## 2012-07-06 MED ORDER — BUPROPION HCL ER (XL) 150 MG PO TB24
150.0000 mg | ORAL_TABLET | Freq: Every day | ORAL | Status: DC
  Administered 2012-07-07 – 2012-07-12 (×6): 150 mg via ORAL
  Filled 2012-07-06 (×5): qty 1
  Filled 2012-07-06: qty 14
  Filled 2012-07-06 (×2): qty 1

## 2012-07-06 MED ORDER — GABAPENTIN 300 MG PO CAPS
300.0000 mg | ORAL_CAPSULE | Freq: Three times a day (TID) | ORAL | Status: DC
  Administered 2012-07-06 – 2012-07-08 (×6): 300 mg via ORAL
  Filled 2012-07-06 (×11): qty 1

## 2012-07-06 NOTE — Progress Notes (Signed)
Adult Psychoeducational Group Note  Date:  07/06/2012 Time:  8:59 PM  Group Topic/Focus:  AA group  Participation Level:  Minimal  Participation Quality:  Appropriate  Affect:  Appropriate  Cognitive:  Alert  Insight: Appropriate  Engagement in Group:  Engaged  Modes of Intervention:  Discussion  Additional Comments:    Luke Dennis 07/06/2012, 8:59 PM

## 2012-07-06 NOTE — Progress Notes (Signed)
Patient ID: Luke Dennis, male   DOB: Mar 20, 1985, 27 y.o.   MRN: [id number] Referral sent to [facility] at patient's request Carney Bern, LCSWA

## 2012-07-06 NOTE — Tx Team (Signed)
Interdisciplinary Treatment Plan Update (Adult)  Date: 07/06/2012  Time Reviewed: 10:25 AM   Progress in Treatment: Attending groups: Yes Participating in groups: Yes Taking medication as prescribed:  Yes Tolerating medication:  Yes Family/Significant othe contact made: Not as yet Patient understands diagnosis: Yes Discussing patient identified problems/goals with staff: Yes Medical problems stabilized or resolved:  Yes Denies suicidal/homicidal ideation: Yes Patient has not harmed self or Others: Yes  New problem(s) identified: None Identified  Discharge Plan or Barriers:  CSW is assessing for appropriate referrals; patient reports over 12 detox and multiple treatment centers  Additional comments: N/A  Reason for Continuation of Hospitalization Anxiety Depression Medical Issues Medication stabilization Withdrawal symptoms   Estimated length of stay: 3 days  For review of initial/current patient goals, please see plan of care.  Attendees: Patient:     Family:     Physician:  Geoffery Lyons 07/06/2012 10:25 AM   Nursing:   Leighton Parody, RN 07/06/2012 10:25 AM   Clinical Social Worker Ronda Fairly 07/06/2012 10:25 AM   Other:  Brigid Re, Pharmd [facility] 07/06/2012 10:25 AM   Other:  Ether Griffins Nursing Student 07/06/2012 10:25 AM   Other:  Maylon Peppers Nursing Student 07/06/2012 10:25 AM   Other: Liliane Bade, P4 07/06/2012 10:25 AM  Other: Harold Barban, RN 07/06/2012 10:25 AM  Other:  Serena Colonel, PA 07/06/2012 10:25 AM    Scribe for Treatment Team:   Carney Bern, LCSWA  07/06/2012 10:25 AM

## 2012-07-06 NOTE — [facility] Group Notes (Signed)
[facility] LCSW Aftercare Discharge Planning Group Note   07/06/2012 8:45 AM   Participation Quality:  Adequate  Mood/Affect:  Blunted and Flat  Depression Rating:  5   Anxiety Rating:  6-7  Thoughts of Suicide:  No Will you contract for safety?   NA  Current AVH:  No  Plan for Discharge/Comments:  Reports history of 12 detox's uncertain what patient is willing to do at this time  Transportation Means: Family  Supports:Family and girlfriend in [facility]  [place], [name]

## 2012-07-06 NOTE — Progress Notes (Signed)
[facility] MD Progress Note  07/06/2012 12:28 PM Luke Dennis  MRN:  [id number] Subjective:  Having a hard time, wants to be detox with Suboxone. Encouraged to work with the Clonidine detox protocol that we have in place for him. He admits to depression, and anxiety. Would like to have those address. He has taken SSRI's and have not been helpful. Discussed using Wellbutrin Diagnosis:  Polysubstance Dependence, Mood Disorder NOS, Anxiety Disorder NOS  ADL's:  Intact  Sleep: Poor  Appetite:  Poor  Suicidal Ideation:  Plan:  denies Intent:  denies Means:  denies Homicidal Ideation:  Plan:  denies Intent:  denies Means:  denies AEB (as evidenced by):  Psychiatric Specialty Exam: Review of Systems  Constitutional: Negative.   HENT: Negative.   Eyes: Negative.   Respiratory: Negative.   Cardiovascular: Negative.   Gastrointestinal: Positive for nausea.  Genitourinary: Negative.   Musculoskeletal: Positive for myalgias.  Skin: Negative.   Neurological: Positive for tremors.  Endo/Heme/Allergies: Negative.   Psychiatric/Behavioral: Positive for depression and substance abuse. The patient is nervous/anxious and has insomnia.     Blood pressure 134/81, pulse 111, temperature 99.1 F (37.3 C), temperature source Oral, resp. rate 16, height 5' 9.5" (1.765 m), weight 94.348 kg (208 lb).Body mass index is 30.29 kg/(m^2).  General Appearance: Fairly Groomed  Patent attorney::  Fair  Speech:  Clear and Coherent  Volume:  fluctuates  Mood:  Anxious, Depressed, Dysphoric and Irritable  Affect:  labile, tearful  Thought Process:  Coherent and Goal Directed  Orientation:  Full (Time, Place, and Person)  Thought Content:  worries, concerns  Suicidal Thoughts:  No  Homicidal Thoughts:  No  Memory:  Immediate;   Fair Recent;   Fair Remote;   Fair  Judgement:  Fair  Insight:  Shallow  Psychomotor Activity:  Restlessness  Concentration:  Fair  Recall:  Fair  Akathisia:  No  Handed:  Right   AIMS (if indicated):     Assets:  Desire for Improvement  Sleep:  Number of Hours: 6.75   Current Medications: Current Facility-Administered Medications  Medication Dose Route Frequency Provider Last Rate Last Dose  . acetaminophen (TYLENOL) tablet 650 mg  650 mg Oral Q6H PRN Rachael Fee, MD      . alum & mag hydroxide-simeth (MAALOX/MYLANTA) 200-200-20 MG/5ML suspension 30 mL  30 mL Oral Q4H PRN Rachael Fee, MD      . chlordiazePOXIDE (LIBRIUM) capsule 25 mg  25 mg Oral Q6H PRN Rachael Fee, MD   25 mg at 07/06/12 1050  . chlordiazePOXIDE (LIBRIUM) capsule 25 mg  25 mg Oral TID Rachael Fee, MD   25 mg at 07/06/12 1214   Followed by  . [START ON 07/07/2012] chlordiazePOXIDE (LIBRIUM) capsule 25 mg  25 mg Oral BH-qamhs Rachael Fee, MD       Followed by  . [START ON 07/08/2012] chlordiazePOXIDE (LIBRIUM) capsule 25 mg  25 mg Oral Daily Rachael Fee, MD      . cloNIDine (CATAPRES) tablet 0.1 mg  0.1 mg Oral QID Rachael Fee, MD   0.1 mg at 07/06/12 1214   Followed by  . [START ON 07/07/2012] cloNIDine (CATAPRES) tablet 0.1 mg  0.1 mg Oral BH-qamhs Rachael Fee, MD       Followed by  . [START ON 07/09/2012] cloNIDine (CATAPRES) tablet 0.1 mg  0.1 mg Oral QAC breakfast Rachael Fee, MD      . dicyclomine (BENTYL) tablet 20 mg  20 mg Oral Q6H PRN Rachael Fee, MD   20 mg at 07/06/12 0825  . gabapentin (NEURONTIN) capsule 300 mg  300 mg Oral TID Rachael Fee, MD   300 mg at 07/06/12 1214  . hydrOXYzine (ATARAX/VISTARIL) tablet 25 mg  25 mg Oral Q6H PRN Rachael Fee, MD   25 mg at 07/05/12 1939  . loperamide (IMODIUM) capsule 2-4 mg  2-4 mg Oral PRN Rachael Fee, MD      . magnesium hydroxide (MILK OF MAGNESIA) suspension 30 mL  30 mL Oral Daily PRN Rachael Fee, MD      . methocarbamol (ROBAXIN) tablet 500 mg  500 mg Oral Q8H PRN Rachael Fee, MD   500 mg at 07/05/12 1558  . multivitamin with minerals tablet 1 tablet  1 tablet Oral Daily Rachael Fee, MD   1 tablet at 07/06/12  4782  . naproxen (NAPROSYN) tablet 500 mg  500 mg Oral BID PRN Rachael Fee, MD   500 mg at 07/06/12 1051  . nicotine (NICODERM CQ - dosed in mg/24 hours) patch 21 mg  21 mg Transdermal Daily Rachael Fee, MD   21 mg at 07/06/12 0825  . ondansetron (ZOFRAN-ODT) disintegrating tablet 4 mg  4 mg Oral Q6H PRN Rachael Fee, MD   4 mg at 07/06/12 1002  . sulfamethoxazole-trimethoprim (BACTRIM,SEPTRA) 400-80 MG per tablet 1 tablet  1 tablet Oral Q12H Rachael Fee, MD   1 tablet at 07/06/12 [phone]  . thiamine (B-1) injection 100 mg  100 mg Intramuscular Once Rachael Fee, MD      . thiamine (VITAMIN B-1) tablet 100 mg  100 mg Oral Daily Rachael Fee, MD   100 mg at 07/06/12 1308  . traZODone (DESYREL) tablet 100 mg  100 mg Oral QHS PRN Rachael Fee, MD   100 mg at 07/05/12 2156    Lab Results: No results found for this or any previous visit (from the past 48 hour(s)).  Physical Findings: AIMS:  , ,  ,  ,    CIWA:  CIWA-Ar Total: 2 COWS:  COWS Total Score: 7  Treatment Plan Summary: Daily contact with patient to assess and evaluate symptoms and progress in treatment Medication management  Plan: Supportive approach/coping skills/relapse prevention           Will add Neurontin 300 mg TID and HS           In Am will start Wellbutrin XL 150 mg  Medical Decision Making Problem Points:  Established problem, worsening (2), Review of last therapy session (1) and Review of psycho-social stressors (1) Data Points:  Review of medication regiment & side effects (2) Review of new medications or change in dosage (2)  I certify that inpatient services furnished can reasonably be expected to improve the patient's condition.   Ceola Para A 07/06/2012, 12:28 PM
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
## 2012-07-06 NOTE — Progress Notes (Signed)
D: Patient appropriate and cooperative with staff and peers. Patient's affect/mood is anxious. Patient complained of withdrawal symptoms; also c/o toothache 8/10. He reported on the self inventory sheet that he requested medication for sleep, appetite is improving, energy level is low and ability to pay attention is poor. Patient rated depression "7" and feelings of hopelessness "8".  A: Support and encouragement provided to patient. Administered scheduled medications per ordering MD. Monitor Q15 minute checks for safety.  R: Patient receptive. Denies SI/HI/AVH. Patient remains safe on the unit.

## 2012-07-07 DIAGNOSIS — F411 Generalized anxiety disorder: Secondary | ICD-10-CM

## 2012-07-07 DIAGNOSIS — F39 Unspecified mood [affective] disorder: Secondary | ICD-10-CM

## 2012-07-07 DIAGNOSIS — F1920 Other psychoactive substance dependence, uncomplicated: Secondary | ICD-10-CM

## 2012-07-07 MED ORDER — BENZOCAINE 10 % MT GEL
Freq: Four times a day (QID) | OROMUCOSAL | Status: DC | PRN
  Administered 2012-07-08 – 2012-07-12 (×4): via OROMUCOSAL
  Filled 2012-07-07: qty 9.4

## 2012-07-07 MED ORDER — TRAZODONE HCL 100 MG PO TABS
100.0000 mg | ORAL_TABLET | Freq: Every evening | ORAL | Status: DC | PRN
  Administered 2012-07-07 – 2012-07-08 (×4): 100 mg via ORAL
  Filled 2012-07-07 (×7): qty 1

## 2012-07-07 NOTE — Progress Notes (Signed)
Adult Psychoeducational Group Note  Date:  07/07/2012 Time:  0900  Group Topic/Focus:  Healthy Communication:   The focus of this group is to discuss communication, barriers to communication, as well as healthy ways to communicate with others.  Participation Level:  Active  Participation Quality:  Attentive, Intrusive, Redirectable and Sharing  Affect:  Anxious and Appropriate  Cognitive:  Alert and Appropriate  Insight: Good  Engagement in Group:  Engaged  Modes of Intervention:  Discussion and Education  Additional Comments:  Pt c/o dry heaves and insomnia r/t withdrawal.  Joshlynn Alfonzo Shari Prows 07/07/2012, 10:04 AM

## 2012-07-07 NOTE — Progress Notes (Signed)
[facility] MD Progress Note  07/07/2012 1:09 PM  ERRIK MITCHELLE   MRN:  [id number]  Subjective:  Luke Dennis continues to have significant withdrawal symptoms, and has been struggling with dry heaves. He reports he is able to eat but cannot hold his food down. He is complaining of sharp stomach pains. He is also experiencing joint pain and muscle aches. His sleep is poor. He endorses passive suicidal thoughts, but denies any intent or plan. He denies any homicidal ideation. He denies any auditory or visual hallucinations, but he does complain of blurred vision.  Diagnosis:   Axis I: Polysubstance Dependence, Mood Disorder NOS, Anxiety Disorder NOS Axis II: Deferred Axis III:  Past Medical History  Diagnosis Date  . Mental disorder   . Depression   . Anxiety     ADL's:  Intact  Sleep: Poor  Appetite:  Fair  Suicidal Ideation:  Endorses passive thoughts, but denies any intent or plan Homicidal Ideation:  Patient denies any thought, plan, or intent AEB (as evidenced by):  Psychiatric Specialty Exam: Review of Systems  Constitutional: Positive for chills and malaise/fatigue.  HENT: Negative.   Eyes: Positive for blurred vision.  Respiratory: Negative.   Cardiovascular: Negative.   Gastrointestinal: Positive for nausea, vomiting and abdominal pain.  Genitourinary: Negative.   Musculoskeletal: Positive for myalgias and back pain.  Skin: Negative.   Neurological: Positive for weakness.  Endo/Heme/Allergies: Negative.   Psychiatric/Behavioral: Positive for depression, suicidal ideas and substance abuse. The patient is nervous/anxious and has insomnia.     Blood pressure 121/83, pulse 94, temperature 97.6 F (36.4 C), temperature source Oral, resp. rate 16, height 5' 9.5" (1.765 m), weight 94.348 kg (208 lb).Body mass index is 30.29 kg/(m^2).  General Appearance: Casual  Eye Contact::  Good  Speech:  Clear and Coherent  Volume:  Normal  Mood:  Anxious and Depressed  Affect:   Constricted  Thought Process:  Linear  Orientation:  Full (Time, Place, and Person)  Thought Content:  WDL  Suicidal Thoughts:  Yes.  without intent/plan  Homicidal Thoughts:  No  Memory:  Immediate;   Good Recent;   Good Remote;   Fair  Judgement:  Impaired  Insight:  Lacking  Psychomotor Activity:  Normal  Concentration:  Good  Recall:  Good  Akathisia:  No  Handed:  Right  AIMS (if indicated):     Assets:  Communication Skills Desire for Improvement Resilience  Sleep:  Number of Hours: 5.75   Current Medications: Current Facility-Administered Medications  Medication Dose Route Frequency Provider Last Rate Last Dose  . acetaminophen (TYLENOL) tablet 650 mg  650 mg Oral Q6H PRN Rachael Fee, MD   650 mg at 07/06/12 1417  . alum & mag hydroxide-simeth (MAALOX/MYLANTA) 200-200-20 MG/5ML suspension 30 mL  30 mL Oral Q4H PRN Rachael Fee, MD      . benzocaine (ORAJEL) 10 % mucosal gel   Mouth/Throat QID PRN Jorje Guild, PA-C      . buPROPion (WELLBUTRIN XL) 24 hr tablet 150 mg  150 mg Oral Daily Rachael Fee, MD   150 mg at 07/07/12 0820  . chlordiazePOXIDE (LIBRIUM) capsule 25 mg  25 mg Oral Q6H PRN Rachael Fee, MD   25 mg at 07/07/12 1151  . chlordiazePOXIDE (LIBRIUM) capsule 25 mg  25 mg Oral BH-qamhs Rachael Fee, MD   25 mg at 07/07/12 4098   Followed by  . [START ON 07/08/2012] chlordiazePOXIDE (LIBRIUM) capsule 25 mg  25 mg Oral  Daily Rachael Fee, MD      . cloNIDine (CATAPRES) tablet 0.1 mg  0.1 mg Oral BH-qamhs Rachael Fee, MD   0.1 mg at 07/07/12 1610   Followed by  . [START ON 07/09/2012] cloNIDine (CATAPRES) tablet 0.1 mg  0.1 mg Oral QAC breakfast Rachael Fee, MD      . dicyclomine (BENTYL) tablet 20 mg  20 mg Oral Q6H PRN Rachael Fee, MD   20 mg at 07/07/12 1151  . gabapentin (NEURONTIN) capsule 300 mg  300 mg Oral TID Rachael Fee, MD   300 mg at 07/07/12 1151  . hydrOXYzine (ATARAX/VISTARIL) tablet 25 mg  25 mg Oral Q6H PRN Rachael Fee, MD   25 mg at  07/05/12 1939  . loperamide (IMODIUM) capsule 2-4 mg  2-4 mg Oral PRN Rachael Fee, MD      . magnesium hydroxide (MILK OF MAGNESIA) suspension 30 mL  30 mL Oral Daily PRN Rachael Fee, MD      . methocarbamol (ROBAXIN) tablet 500 mg  500 mg Oral Q8H PRN Rachael Fee, MD   500 mg at 07/05/12 1558  . multivitamin with minerals tablet 1 tablet  1 tablet Oral Daily Rachael Fee, MD   1 tablet at 07/07/12 0819  . naproxen (NAPROSYN) tablet 500 mg  500 mg Oral BID PRN Rachael Fee, MD   500 mg at 07/07/12 0413  . nicotine (NICODERM CQ - dosed in mg/24 hours) patch 21 mg  21 mg Transdermal Daily Rachael Fee, MD   21 mg at 07/07/12 0818  . ondansetron (ZOFRAN-ODT) disintegrating tablet 4 mg  4 mg Oral Q6H PRN Rachael Fee, MD   4 mg at 07/07/12 1123  . sulfamethoxazole-trimethoprim (BACTRIM,SEPTRA) 400-80 MG per tablet 1 tablet  1 tablet Oral Q12H Rachael Fee, MD   1 tablet at 07/07/12 [phone]  . thiamine (B-1) injection 100 mg  100 mg Intramuscular Once Rachael Fee, MD      . thiamine (VITAMIN B-1) tablet 100 mg  100 mg Oral Daily Rachael Fee, MD   100 mg at 07/07/12 0819  . traZODone (DESYREL) tablet 100 mg  100 mg Oral QHS,MR X 1 Jorje Guild, PA-C        Lab Results: No results found for this or any previous visit (from the past 48 hour(s)).  Physical Findings: AIMS:  , ,  ,  ,    CIWA:  CIWA-Ar Total: 7 COWS:  COWS Total Score: 8  Treatment Plan Summary: Daily contact with patient to assess and evaluate symptoms and progress in treatment Medication management Research options for referrals for continued substance abuse treatment  Plan: Continue Librium per protocol.  Medical Decision Making Problem Points:  Established problem, stable/improving (1), Review of last therapy session (1) and Review of psycho-social stressors (1) Data Points:  Review or order clinical lab tests (1) Review and summation of old records (2) Review of new medications or change in dosage (2)  I certify  that inpatient services furnished can reasonably be expected to improve the patient's condition.   WATT,ALAN 07/07/2012, 1:09 PM   Discussed with provider. Reviewed note. Agree with above findings, assessment and plan.   Jacqulyn Cane, M.D.  07/07/2012 7:17 PM
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
## 2012-07-07 NOTE — Progress Notes (Signed)
Patient ID: Luke Dennis, male   DOB: 21-Dec-1985, 27 y.o.   MRN: [id number] D. The patient has an anxious mood and affect. Spent most of the evening watching TV and interacting in the milieu. Attended evening AA/NA group.  A. Met with patient. Reviewed and administered medications. Assessed for withdrawal symptoms. Verbal support and encouragement given. Encouraged to attend evening group. R. The patient denied any suicidal ideation. Medicated for anxiety related to withdrawal. Ciwa=2

## 2012-07-07 NOTE — Progress Notes (Signed)
Psychoeducational Group Note  Date:  07/07/2012 Time:  0945 am  Group Topic/Focus:  Identifying Needs:   The focus of this group is to help patients identify their personal needs that have been historically problematic and identify healthy behaviors to address their needs.  Participation Level:  Active  Participation Quality:  Appropriate  Affect:  Appropriate  Cognitive:  Alert  Insight:  Engaged  Engagement in Group:  Engaged  Additional Comments:    Andrena Mews 07/07/2012,3:18 PM

## 2012-07-08 MED ORDER — PANTOPRAZOLE SODIUM 40 MG PO TBEC
40.0000 mg | DELAYED_RELEASE_TABLET | Freq: Two times a day (BID) | ORAL | Status: DC
  Administered 2012-07-08 – 2012-07-12 (×8): 40 mg via ORAL
  Filled 2012-07-08 (×13): qty 1

## 2012-07-08 MED ORDER — GABAPENTIN 400 MG PO CAPS
400.0000 mg | ORAL_CAPSULE | Freq: Three times a day (TID) | ORAL | Status: DC
  Administered 2012-07-08 – 2012-07-09 (×4): 400 mg via ORAL
  Filled 2012-07-08 (×6): qty 1

## 2012-07-08 NOTE — Progress Notes (Signed)
Patient ID: Luke Dennis, male   DOB: 1985/11/19, 27 y.o.   MRN: [id number] D. The patient has a anxious mood and affect. C/o tooth pain where he broke off a piece of his tooth.  Was concerned that he wasn't getting enough Neurontin. Interacting in the milieu. A. Met with the patient. Offered pain medication for tooth pain. Encouraged to attend evening group. Reviewed and administered HS medications. R. The patient did not get much relief from pain medication. Stated he felt it was because he had been abusing strong pain medications for a long time. Attended evening group.

## 2012-07-08 NOTE — Progress Notes (Signed)
D   Pt is pleasant and appropriate  He continues to be med seeking    He complained of dry heaves again and received medication   He expresses desire to get clean and change his lifestyle and does seem invested in his treatment   He attends and participates in groups and has some insight A   Verbal support given   Medications administered and effectiveness monitored   Q 15 min checks R   Pt safe at present

## 2012-07-08 NOTE — Progress Notes (Addendum)
Patient ID: Luke Dennis, male   DOB: 26-Mar-1985, 27 y.o.   MRN: [id number] [facility] MD Progress Note  07/08/2012 1:22 PM  Luke Dennis   MRN:  [id number]  Subjective:  Luke Dennis reports that he continues to have significant withdrawal symptoms, stomach pains and dry heaves. He reports that he is still feeling anxious and having tooth pains. Does not know if his medications are working or not. Blamed his addiction on the fact that nothing helps him feel good out there but the drugs. Wondered if he will ever get better or sober up from drug use. He added, "This is what I do. I have done and used a lot of drugs. I bet I'm paying for it now".  Diagnosis:   Axis I: Polysubstance Dependence, Mood Disorder NOS, Anxiety Disorder NOS Axis II: Deferred Axis III:  Past Medical History  Diagnosis Date  . Mental disorder   . Depression   . Anxiety     ADL's:  Intact  Sleep: Poor  Appetite:  Fair  Suicidal Ideation:  Endorses passive thoughts, but denies any intent or plan Homicidal Ideation:  Patient denies any thought, plan, or intent AEB (as evidenced by):  Psychiatric Specialty Exam: Review of Systems  Constitutional: Positive for chills and malaise/fatigue.  HENT: Negative.   Eyes: Positive for blurred vision.  Respiratory: Negative.   Cardiovascular: Negative.   Gastrointestinal: Positive for nausea, vomiting and abdominal pain.  Genitourinary: Negative.   Musculoskeletal: Positive for myalgias and back pain.  Skin: Negative.   Neurological: Positive for weakness.  Endo/Heme/Allergies: Negative.   Psychiatric/Behavioral: Positive for depression, suicidal ideas and substance abuse. The patient is nervous/anxious and has insomnia.     Blood pressure 97/64, pulse 105, temperature 96.3 F (35.7 C), temperature source Oral, resp. rate 20, height 5' 9.5" (1.765 m), weight 94.348 kg (208 lb).Body mass index is 30.29 kg/(m^2).  General Appearance: Casual  Eye Contact::  Good   Speech:  Clear and Coherent  Volume:  Normal  Mood:  Anxious and Depressed  Affect:  Constricted  Thought Process:  Linear  Orientation:  Full (Time, Place, and Person)  Thought Content:  WDL  Suicidal Thoughts:  Yes.  without intent/plan  Homicidal Thoughts:  No  Memory:  Immediate;   Good Recent;   Good Remote;   Fair  Judgement:  Impaired  Insight:  Lacking  Psychomotor Activity:  Normal  Concentration:  Good  Recall:  Good  Akathisia:  No  Handed:  Right  AIMS (if indicated):     Assets:  Communication Skills Desire for Improvement Resilience  Sleep:  Number of Hours: 5.75   Current Medications: Current Facility-Administered Medications  Medication Dose Route Frequency Provider Last Rate Last Dose  . acetaminophen (TYLENOL) tablet 650 mg  650 mg Oral Q6H PRN Rachael Fee, MD   650 mg at 07/08/12 [id number]  . alum & mag hydroxide-simeth (MAALOX/MYLANTA) 200-200-20 MG/5ML suspension 30 mL  30 mL Oral Q4H PRN Rachael Fee, MD      . benzocaine (ORAJEL) 10 % mucosal gel   Mouth/Throat QID PRN Jorje Guild, PA-C      . buPROPion (WELLBUTRIN XL) 24 hr tablet 150 mg  150 mg Oral Daily Rachael Fee, MD   150 mg at 07/08/12 0801  . cloNIDine (CATAPRES) tablet 0.1 mg  0.1 mg Oral BH-qamhs Rachael Fee, MD   0.1 mg at 07/08/12 0802   Followed by  . [START ON 07/09/2012] cloNIDine (CATAPRES) tablet  0.1 mg  0.1 mg Oral QAC breakfast Rachael Fee, MD      . dicyclomine (BENTYL) tablet 20 mg  20 mg Oral Q6H PRN Rachael Fee, MD   20 mg at 07/08/12 1042  . gabapentin (NEURONTIN) capsule 400 mg  400 mg Oral TID Sanjuana Kava, NP   400 mg at 07/08/12 1043  . loperamide (IMODIUM) capsule 2-4 mg  2-4 mg Oral PRN Rachael Fee, MD      . magnesium hydroxide (MILK OF MAGNESIA) suspension 30 mL  30 mL Oral Daily PRN Rachael Fee, MD   30 mL at 07/08/12 0803  . methocarbamol (ROBAXIN) tablet 500 mg  500 mg Oral Q8H PRN Rachael Fee, MD   500 mg at 07/08/12 1043  . multivitamin with minerals  tablet 1 tablet  1 tablet Oral Daily Rachael Fee, MD   1 tablet at 07/08/12 0803  . naproxen (NAPROSYN) tablet 500 mg  500 mg Oral BID PRN Rachael Fee, MD   500 mg at 07/08/12 0524  . nicotine (NICODERM CQ - dosed in mg/24 hours) patch 21 mg  21 mg Transdermal Daily Rachael Fee, MD   21 mg at 07/08/12 0802  . ondansetron (ZOFRAN-ODT) disintegrating tablet 4 mg  4 mg Oral Q6H PRN Rachael Fee, MD   4 mg at 07/07/12 1123  . pantoprazole (PROTONIX) EC tablet 40 mg  40 mg Oral BID Sanjuana Kava, NP   40 mg at 07/08/12 1043  . sulfamethoxazole-trimethoprim (BACTRIM,SEPTRA) 400-80 MG per tablet 1 tablet  1 tablet Oral Q12H Rachael Fee, MD   1 tablet at 07/08/12 0804  . thiamine (B-1) injection 100 mg  100 mg Intramuscular Once Rachael Fee, MD      . thiamine (VITAMIN B-1) tablet 100 mg  100 mg Oral Daily Rachael Fee, MD   100 mg at 07/08/12 0803  . traZODone (DESYREL) tablet 100 mg  100 mg Oral QHS,MR X 1 Jorje Guild, PA-C   100 mg at 07/07/12 2214    Lab Results: No results found for this or any previous visit (from the past 48 hour(s)).  Physical Findings: AIMS: Facial and Oral Movements Muscles of Facial Expression: None, normal Lips and Perioral Area: None, normal Jaw: None, normal Tongue: None, normal,Extremity Movements Upper (arms, wrists, hands, fingers): None, normal Lower (legs, knees, ankles, toes): None, normal, Trunk Movements Neck, shoulders, hips: None, normal, Overall Severity Severity of abnormal movements (highest score from questions above): None, normal Incapacitation due to abnormal movements: None, normal Patient's awareness of abnormal movements (rate only patient's report): No Awareness, Dental Status Current problems with teeth and/or dentures?: No Does patient usually wear dentures?: No  CIWA:  CIWA-Ar Total: 3 COWS:  COWS Total Score: 9  Treatment Plan Summary: Daily contact with patient to assess and evaluate symptoms and progress in  treatment Medication management Research options for referrals for continued substance abuse treatment  Plan: Supportive approach/coping skills/relapse prevention. Add Protonix 40 mg bid for acid reflux. Increased Neurontin from 300 mg tid to 400 mg tid for anxiety. Encouraged out of room, participation in group sessions and application of coping skills when distressed. Will continue to monitor response to/adverse effects of medications in use to assure effectiveness. Continue to monitor mood, behavior and interaction with staff and other patients. Continue current plan of care.   Medical Decision Making Problem Points:  Established problem, stable/improving (1), Review of last therapy session (1) and  Review of psycho-social stressors (1) Data Points:  Review or order clinical lab tests (1) Review and summation of old records (2) Review of new medications or change in dosage (2)  I certify that inpatient services furnished can reasonably be expected to improve the patient's condition.   Armandina Stammer I 07/08/2012, 1:22 PM   Discussed with provider. Reviewed note. Agree with above findings, assessment and plan.   Jacqulyn Cane, M.D.  07/08/2012 8:42 PM

## 2012-07-08 NOTE — Progress Notes (Signed)
Adult Psychoeducational Group Note  Date:  07/08/2012 Time:  5:33 PM  Group Topic/Focus:  Conflict Resolution:   The focus of this group is to discuss the conflict resolution process and how it may be used upon discharge.  Participation Level:  Active  Participation Quality:  Appropriate, Sharing and Supportive  Affect:  Appropriate  Cognitive:  Appropriate  Insight: Appropriate  Engagement in Group:  Engaged and Supportive  Modes of Intervention:  Discussion, Education and Support  Additional Comments:  Pt was appropriate.  Luke Dennis M 07/08/2012, 5:33 PM

## 2012-07-08 NOTE — [facility] Group Notes (Signed)
[facility] Group Notes:  (Clinical Social Work)  07/08/2012  10:00-11:00AM  Summary of Progress/Problems:   The main focus of today's process group was to   identify the patient's current support system and decide on other supports that can be put in place.  Four definitions/levels of support were discussed and an exercise was utilized to show how much stronger we become with additional supports.  An emphasis was placed on using counselor, doctor, therapy groups, 12-step groups, and problem-specific support groups to expand supports, as well as doing something different than has been done before. The patient expressed a willingness to add back his AA meetings, his sponsor, working the 12 steps, doing service work, personal relationship with God, and to call his sponsor with problems instead of getting "cocky" about what he can do on his own.  Additionally, he intends to live in a halfway house for 6 months at discharge.  Also, he is going to follow his sponsor's recommendations, including turning over money for money management if needed.  Type of Therapy:  Process Group with Motivational Interviewing  Participation Level:  Active  Participation Quality:  Attentive and Sharing  Affect:  Blunted and Depressed  Cognitive:  Appropriate  Insight:  Engaged  Engagement in Therapy:  Engaged  Modes of Intervention:   Clarification, Education, Support and Processing, Activity  [employer], LCSW 07/08/2012, 12:18 PM

## 2012-07-08 NOTE — Progress Notes (Signed)
Psychoeducational Group Note  Date:  07/08/2012 Time:  0945 am  Group Topic/Focus:  Making Healthy Choices:   The focus of this group is to help patients identify negative/unhealthy choices they were using prior to admission and identify positive/healthier coping strategies to replace them upon discharge.  Participation Level:  Active  Participation Quality:  Sharing  Affect:  Appropriate  Cognitive:  Alert  Insight:  Engaged  Engagement in Group:  Supportive  Additional Comments:    Andrena Mews 07/08/2012, 10:29 AM

## 2012-07-08 NOTE — Progress Notes (Signed)
Adult Psychoeducational Group Note  Date:  07/08/2012 Time:  0830  Group Topic/Focus:  Self inventory sheet  Participation Level:  Active  Participation Quality:  Appropriate  Affect:  Appropriate  Cognitive:  Appropriate  Insight: Appropriate  Engagement in Group:  Engaged  Modes of Intervention:  Discussion    Roselee Culver 07/08/2012, 9:35 AM

## 2012-07-08 NOTE — Progress Notes (Signed)
Patient ID: Luke Dennis, male   DOB: [birth year], 27 y.o.   MRN: [id number] D. The patient has a brighter mood and affect this evening. He is less anxious and stated he was physically feeling better. Reports he finally moved his bowels. Interacting appropriately in the milieu. Attended evening AA/NA group.  A. Met with the patient. Discussed his discharge plans for recovery. Verbal support given. Reviewed and administered medications. R. Denied withdrawal symptoms. Plans on going to a half way house program for 6 months. He is hoping he can get a job there which will help him to maintain his sobriety. He is also considering going down to [place] after his baby is born.

## 2012-07-09 ENCOUNTER — Inpatient Hospital Stay (HOSPITAL_COMMUNITY): Payer: Federal, State, Local not specified - Other

## 2012-07-09 ENCOUNTER — Encounter (HOSPITAL_COMMUNITY): Payer: Self-pay | Admitting: Emergency Medicine

## 2012-07-09 LAB — CBC WITH DIFFERENTIAL/PLATELET
Hemoglobin: 12.9 g/dL — ABNORMAL LOW (ref 13.0–17.0)
Lymphocytes Relative: 31 % (ref 12–46)
Lymphs Abs: 2.9 10*3/uL (ref 0.7–4.0)
MCV: 92.5 fL (ref 78.0–100.0)
Neutrophils Relative %: 61 % (ref 43–77)
Platelets: 223 10*3/uL (ref 150–400)
RBC: 4.25 MIL/uL (ref 4.22–5.81)
WBC: 9.4 10*3/uL (ref 4.0–10.5)

## 2012-07-09 LAB — COMPREHENSIVE METABOLIC PANEL
ALT: 25 U/L (ref 0–53)
Alkaline Phosphatase: 74 U/L (ref 39–117)
CO2: 28 mEq/L (ref 19–32)
Chloride: 101 mEq/L (ref 96–112)
GFR calc Af Amer: >90 mL/min (ref >90)
GFR calc non Af Amer: >90 mL/min (ref >90)
Glucose, Bld: 118 mg/dL — ABNORMAL HIGH (ref 70–99)
Potassium: 4.2 mEq/L (ref 3.5–5.1)
Sodium: 137 mEq/L (ref 135–145)
Total Bilirubin: 0.5 mg/dL (ref 0.3–1.2)

## 2012-07-09 LAB — URINALYSIS, MICROSCOPIC ONLY
Glucose, UA: NEGATIVE mg/dL
Hgb urine dipstick: NEGATIVE
Ketones, ur: NEGATIVE mg/dL
Leukocytes, UA: NEGATIVE
pH: 6 (ref 5.0–8.0)

## 2012-07-09 LAB — RAPID URINE DRUG SCREEN, HOSP PERFORMED
Barbiturates: NOT DETECTED
Benzodiazepines: POSITIVE — AB

## 2012-07-09 MED ORDER — DICYCLOMINE HCL 20 MG PO TABS: 20.0000 mg | ORAL_TABLET | Freq: Two times a day (BID) | ORAL | Status: DC

## 2012-07-09 MED ORDER — GABAPENTIN 300 MG PO CAPS
600.0000 mg | ORAL_CAPSULE | Freq: Three times a day (TID) | ORAL | Status: DC
  Administered 2012-07-09 – 2012-07-12 (×9): 600 mg via ORAL
  Filled 2012-07-09 (×14): qty 2

## 2012-07-09 MED ORDER — TRAZODONE HCL 100 MG PO TABS
200.0000 mg | ORAL_TABLET | Freq: Every evening | ORAL | Status: DC | PRN
  Administered 2012-07-09 – 2012-07-11 (×3): 200 mg via ORAL
  Filled 2012-07-09: qty 2
  Filled 2012-07-09: qty 28
  Filled 2012-07-09 (×4): qty 1

## 2012-07-09 MED ORDER — GABAPENTIN 300 MG PO CAPS
300.0000 mg | ORAL_CAPSULE | Freq: Once | ORAL | Status: DC
  Filled 2012-07-09: qty 1

## 2012-07-09 MED ORDER — SUCRALFATE 1 G PO TABS
1.0000 g | ORAL_TABLET | Freq: Once | ORAL | Status: AC
  Administered 2012-07-09: 1 g via ORAL
  Filled 2012-07-09 (×2): qty 1

## 2012-07-09 MED ORDER — SUCRALFATE 1 G PO TABS
1.0000 g | ORAL_TABLET | Freq: Three times a day (TID) | ORAL | Status: DC
  Administered 2012-07-09 – 2012-07-12 (×12): 1 g via ORAL
  Filled 2012-07-09: qty 56
  Filled 2012-07-09: qty 1
  Filled 2012-07-09: qty 56
  Filled 2012-07-09 (×2): qty 1
  Filled 2012-07-09: qty 56
  Filled 2012-07-09 (×11): qty 1
  Filled 2012-07-09: qty 56
  Filled 2012-07-09 (×2): qty 1

## 2012-07-09 MED ORDER — GABAPENTIN 100 MG PO CAPS
200.0000 mg | ORAL_CAPSULE | Freq: Once | ORAL | Status: AC
  Administered 2012-07-09: 200 mg via ORAL
  Filled 2012-07-09 (×2): qty 2
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
## 2012-07-09 NOTE — Progress Notes (Signed)
Patient ID: Luke Dennis, male   DOB: 1985-06-15, 27 y.o.   MRN: [id number] He has returned from [facility] and X-Ray was done. No cause was found to cause his abdominal pain. Prescription  Was given to him for bentyl.  Upon return he was alert verbal with no c/o discomfort his 17:00 medication was given on his return.

## 2012-07-09 NOTE — Progress Notes (Signed)
[facility] MD Progress Note  07/09/2012 4:24 PM ELDER DAVIDIAN  MRN:  [id number] Subjective:  Still very anxious, mood unstable with acute abdominal pain. Has past history of an ulcer. Concernred as he has vomited blood. The Protonix is helping but not enough. Still concerned about the way he is feeling and suicidal thoughts when he feels like this Diagnosis:  Polysubstance Dependence, Substance Induced Mood Disorder  ADL's:  Intact  Sleep: Poor  Appetite:  Poor  Suicidal Ideation:  Plan:  denies Intent:  denies Means:  denies Homicidal Ideation:  Plan:  denies Intent:  denies Means:  denies AEB (as evidenced by):  Psychiatric Specialty Exam: Review of Systems  Constitutional: Negative.   HENT: Negative.   Eyes: Negative.   Respiratory: Negative.   Cardiovascular: Negative.   Gastrointestinal: Positive for nausea and abdominal pain.  Genitourinary: Negative.   Musculoskeletal: Negative.   Skin: Negative.   Neurological: Negative.   Endo/Heme/Allergies: Negative.   Psychiatric/Behavioral: Positive for depression and substance abuse. The patient is nervous/anxious and has insomnia.     Blood pressure 124/71, pulse 86, temperature 98.4 F (36.9 C), temperature source Oral, resp. rate 16, height 5' 9.5" (1.765 m), weight 94.348 kg (208 lb), SpO2 98.00%.Body mass index is 30.29 kg/(m^2).  General Appearance: Fairly Groomed  Patent attorney::  Fair  Speech:  Clear and Coherent and Slow  Volume:  Decreased  Mood:  Anxious, Depressed and worried, in pain  Affect:  worried in pain  Thought Process:  Coherent and Goal Directed  Orientation:  Full (Time, Place, and Person)  Thought Content:  Rumination and worries, concerns  Suicidal Thoughts:  Yes.  without intent/plan  Homicidal Thoughts:  No  Memory:  Immediate;   Fair Recent;   Fair Remote;   Fair  Judgement:  Fair  Insight:  Shallow  Psychomotor Activity:  Restlessness  Concentration:  Fair  Recall:  Fair  Akathisia:  No   Handed:  Right  AIMS (if indicated):     Assets:  Desire for Improvement  Sleep:  Number of Hours: 5.5   Current Medications: Current Facility-Administered Medications  Medication Dose Route Frequency Provider Last Rate Last Dose  . acetaminophen (TYLENOL) tablet 650 mg  650 mg Oral Q6H PRN Rachael Fee, MD   650 mg at 07/08/12 [phone]  . alum & mag hydroxide-simeth (MAALOX/MYLANTA) 200-200-20 MG/5ML suspension 30 mL  30 mL Oral Q4H PRN Rachael Fee, MD      . benzocaine (ORAJEL) 10 % mucosal gel   Mouth/Throat QID PRN Jorje Guild, PA-C      . buPROPion (WELLBUTRIN XL) 24 hr tablet 150 mg  150 mg Oral Daily Rachael Fee, MD   150 mg at 07/09/12 0816  . cloNIDine (CATAPRES) tablet 0.1 mg  0.1 mg Oral QAC breakfast Rachael Fee, MD   0.1 mg at 07/09/12 0817  . dicyclomine (BENTYL) tablet 20 mg  20 mg Oral Q6H PRN Rachael Fee, MD   20 mg at 07/08/12 1042  . gabapentin (NEURONTIN) capsule 600 mg  600 mg Oral TID Rachael Fee, MD      . loperamide (IMODIUM) capsule 2-4 mg  2-4 mg Oral PRN Rachael Fee, MD      . magnesium hydroxide (MILK OF MAGNESIA) suspension 30 mL  30 mL Oral Daily PRN Rachael Fee, MD   30 mL at 07/08/12 0803  . methocarbamol (ROBAXIN) tablet 500 mg  500 mg Oral Q8H PRN Rachael Fee, MD  500 mg at 07/09/12 0816  . multivitamin with minerals tablet 1 tablet  1 tablet Oral Daily Rachael Fee, MD   1 tablet at 07/09/12 0816  . naproxen (NAPROSYN) tablet 500 mg  500 mg Oral BID PRN Rachael Fee, MD   500 mg at 07/09/12 0816  . nicotine (NICODERM CQ - dosed in mg/24 hours) patch 21 mg  21 mg Transdermal Daily Rachael Fee, MD   21 mg at 07/09/12 0814  . ondansetron (ZOFRAN-ODT) disintegrating tablet 4 mg  4 mg Oral Q6H PRN Rachael Fee, MD   4 mg at 07/07/12 1123  . pantoprazole (PROTONIX) EC tablet 40 mg  40 mg Oral BID Sanjuana Kava, NP   40 mg at 07/09/12 0816  . sucralfate (CARAFATE) tablet 1 g  1 g Oral TID WC & HS Rachael Fee, MD      .  sulfamethoxazole-trimethoprim (BACTRIM,SEPTRA) 400-80 MG per tablet 1 tablet  1 tablet Oral Q12H Rachael Fee, MD   1 tablet at 07/09/12 [phone]  . thiamine (B-1) injection 100 mg  100 mg Intramuscular Once Rachael Fee, MD      . thiamine (VITAMIN B-1) tablet 100 mg  100 mg Oral Daily Rachael Fee, MD   100 mg at 07/09/12 0817  . traZODone (DESYREL) tablet 100 mg  100 mg Oral QHS,MR X 1 Jorje Guild, PA-C   100 mg at 07/08/12 2310   Current Outpatient Prescriptions  Medication Sig Dispense Refill  . ALPRAZolam (XANAX) 1 MG tablet Take 1 mg by mouth 3 (three) times daily as needed for anxiety.      Marland Kitchen HYDROcodone-acetaminophen (NORCO/VICODIN) 5-325 MG per tablet Take 1 tablet by mouth every 6 (six) hours as needed for pain.        Lab Results:  Results for orders placed during the hospital encounter of 07/05/12 (from the past 48 hour(s))  URINALYSIS, MICROSCOPIC ONLY     Status: Abnormal   Collection Time    07/09/12  2:15 PM      Result Value Range   Color, Urine YELLOW  YELLOW   APPearance CLEAR  CLEAR   Specific Gravity, Urine 1.031 (*) 1.005 - 1.030   pH 6.0  5.0 - 8.0   Glucose, UA NEGATIVE  NEGATIVE mg/dL   Hgb urine dipstick NEGATIVE  NEGATIVE   Bilirubin Urine NEGATIVE  NEGATIVE   Ketones, ur NEGATIVE  NEGATIVE mg/dL   Protein, ur NEGATIVE  NEGATIVE mg/dL   Urobilinogen, UA 0.2  0.0 - 1.0 mg/dL   Nitrite NEGATIVE  NEGATIVE   Leukocytes, UA NEGATIVE  NEGATIVE   Bacteria, UA FEW (*) RARE   Urine-Other MUCOUS PRESENT      Physical Findings: AIMS: Facial and Oral Movements Muscles of Facial Expression: None, normal Lips and Perioral Area: None, normal Jaw: None, normal Tongue: None, normal,Extremity Movements Upper (arms, wrists, hands, fingers): None, normal Lower (legs, knees, ankles, toes): None, normal, Trunk Movements Neck, shoulders, hips: None, normal, Overall Severity Severity of abnormal movements (highest score from questions above): None, normal Incapacitation  due to abnormal movements: None, normal Patient's awareness of abnormal movements (rate only patient's report): No Awareness, Dental Status Current problems with teeth and/or dentures?: No Does patient usually wear dentures?: No  CIWA:  CIWA-Ar Total: 0 COWS:  COWS Total Score: 0  Treatment Plan Summary: Daily contact with patient to assess and evaluate symptoms and progress in treatment Medication management  Plan: Supportive approach/coping skills/relapse prevention  Increase the Neurontin            Add Carafate            Consult Internal Medicine  Medical Decision Making Problem Points:  Review of last therapy session (1) and Review of psycho-social stressors (1) Data Points:  Review of medication regiment & side effects (2) Review of new medications or change in dosage (2)  I certify that inpatient services furnished can reasonably be expected to improve the patient's condition.   Dareld Mcauliffe A 07/09/2012, 4:24 PM

## 2012-07-09 NOTE — Progress Notes (Signed)
Patient ID: Luke Dennis, male   DOB: 20-Feb-1986, 27 y.o.   MRN: [id number] He has been up and to groups interacting with staff and peers. He says that he is having mood swings.  Has c/o and received PRN medication for his abdomen pain and was sent to the [facility] for evaluation.  Self inventory: Depression 6, hopelessness 5, W/S of diarrhea, has not requested and medication and he was given MOM yesterday,  Agitation and SI thoughts off and on. He has contract  for safety.

## 2012-07-09 NOTE — ED Notes (Signed)
Pt reports detoxing off benzos and opiates. Reports last use was Thursday am. Pt now c/o sharp abd pain. Pt in from [facility].

## 2012-07-09 NOTE — ED Notes (Signed)
Pt complains of left abdominal pain x 2 days. Pt complains of nausea and vomiting.

## 2012-07-09 NOTE — ED Notes (Signed)
Patient transported to X-ray 

## 2012-07-09 NOTE — [facility] Group Notes (Signed)
[facility] LCSW Aftercare Discharge Planning Group Note   07/09/2012 8:45 AM  Participation Quality:  Appropriate  Mood/Affect:  Flat  Depression Rating:  5  Anxiety Rating:  6-7  Thoughts of Suicide:  No Will you contract for safety?   NA  Current AVH:  No  Plan for Discharge/Comments:  [place] or [facility]:  Uncertain, family  Supports: Family, girlfriend  [place], [name]

## 2012-07-09 NOTE — Progress Notes (Signed)
Patient ID: Luke Dennis, male   DOB: [birth year], 27 y.o.   MRN: [id number] Patient given list of [facility] in area as first choice of discharge to [facility] is not feasible; according to director at [facility] patient has used up available number of days for service within a 3 year period. Patient understands need to call and request interview time and date. Also understands he will need a back up plan as will discharge to interview at [facility].  Carney Bern, LCSWA

## 2012-07-09 NOTE — Progress Notes (Signed)
Pt attended AA group throughout its entirety; pt affect was flat, appeared inattentive.

## 2012-07-09 NOTE — Progress Notes (Signed)
Patient did attend the evening speaker AA meeting.  

## 2012-07-09 NOTE — Progress Notes (Addendum)
I was called by the Psych PA  for Medicine consult for RUQ pain x 2-3 days on this patient, however at this time no labs or any imaging were available, I requested the PA to send the patient to the ER for stat CBC,CMP, Lipase, Ac.abd XRay and RUQ [place] vs CT.

## 2012-07-09 NOTE — ED Notes (Signed)
PA at bedside.
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
## 2012-07-09 NOTE — [facility] Group Notes (Signed)
[facility] LCSW Group Therapy  07/09/2012 1:15 PM  Type of Therapy:  Group Therapy 1:15 to 2;30  Participation Level:  Did Not Attend; patient at ED    Nkosi Cortright, [name]

## 2012-07-10 DIAGNOSIS — F1994 Other psychoactive substance use, unspecified with psychoactive substance-induced mood disorder: Principal | ICD-10-CM

## 2012-07-10 MED ORDER — METHOCARBAMOL 500 MG PO TABS
500.0000 mg | ORAL_TABLET | Freq: Three times a day (TID) | ORAL | Status: DC | PRN
  Administered 2012-07-10 – 2012-07-11 (×2): 500 mg via ORAL
  Filled 2012-07-10 (×2): qty 1

## 2012-07-10 NOTE — Progress Notes (Signed)
[facility] LCSW Aftercare Discharge Planning Group Note   07/10/2012 8:45 AM  Participation Quality:  Appropriate  Mood/Affect:  Appropriate, very patient as he was last and others were interrupting  Depression Rating:  5  Anxiety Rating:  5-6  Thoughts of Suicide:  No Will you contract for safety?   NA  Current AVH:  No  Plan for Discharge/Comments:  Patient has been referred to [facility] for a male [facility] bed. Patient also requesting CSW contact his mother re any financial help for treatment.   Transportation Means: Unknown  Supports: Family, girlfriend  [place], [name]

## 2012-07-10 NOTE — Progress Notes (Signed)
Patient ID: Luke Dennis, male   DOB: 06-08-1985, 27 y.o.   MRN: [id number] He has been  laying in bed part of today. He spoke of not being able to stay clean that he has peroids of 6-8 months then relapses, that this has been a cycle for him. Also said that the police was looking for him and that he had a court date next month.  Dressing changed on his back and there was no s/s of infection.

## 2012-07-10 NOTE — Progress Notes (Signed)
Adult Psychoeducational Group Note  Date:  07/10/2012 Time:  10:02 PM  Group Topic/Focus:  Wrap-Up Group:   The focus of this group is to help patients review their daily goal of treatment and discuss progress on daily workbooks.  Participation Level:  Active  Participation Quality:  Monopolizing and Redirectable  Affect:  Anxious and Appropriate  Cognitive:  Alert and Disorganized  Insight: Improving  Engagement in Group:  Monopolizing and Off Topic  Modes of Intervention:  Clarification and Limit-setting  Additional Comments:  Pt kept interrupting other patients while they were talking, adding his thoughts and opinions on every patient's situation. Pt was redirectable.   Humberto Seals Monique 07/10/2012, 10:02 PM

## 2012-07-10 NOTE — Progress Notes (Signed)
Patient ID: Luke Dennis, male   DOB: Nov 29, 1985, 27 y.o.   MRN: [id number]  D:  Pt was pleasant but intrusive, and sometimes silly acting. Pt and one of his peers followed each other throughout the shift. However, pt was easily redirectable and was able to calm his peer during a moment when peer was escalating. Pt complained of insomnia and explained a conversation he'd had with his Dr. Before med pass the writer spoke with pt's Dr and was informed to change trazodone dose and frequency. Pt voiced no other concerns, questions, or complaints.   A:  Support and encouragement was offered. 15 min checks continued for safety.  R: Pt remains safe.

## 2012-07-10 NOTE — [facility] Suicide Risk Assessment (Signed)
[facility] INPATIENT:  Family/Significant Other Suicide Prevention Education  Suicide Prevention Education:  Education Completed; Patient's mother, Berneice Heinrich at [phone]  has been identified as the person(s) who will aid the patient in the event of a mental health crisis (suicidal ideations/suicide attempt).  With written consent from the patient, the family member/significant other has been provided the following suicide prevention education, prior to the and/or following the discharge of the patient.  The suicide prevention education provided includes the following:  Suicide risk factors  Suicide prevention and interventions  National Suicide Hotline telephone number  [facility] assessment telephone number  [facility] Emergency Assistance 911  [facility] and/or Residential Mobile Crisis Unit telephone number  Request made of family/significant other to:  Remove weapons (e.g., guns, rifles, knives), all items previously/currently identified as safety concern.  Ms Graceann Congress reports that although patient will not be residing with her she will have all firearms in the home moved into locked gun safe tonight.   Remove drugs/medications (over-the-counter, prescriptions, illicit drugs), all items previously/currently identified as a safety concern.  The family member/significant other verbalizes understanding of the suicide prevention education information provided.  The family member/significant other agrees to remove the items of safety concern listed above.  Clide Dales 07/10/2012, 4:33 PM

## 2012-07-10 NOTE — Progress Notes (Signed)
Recreation Therapy Notes  Date: 04.29.2014 Time: 3:00pm Location: [address] Day[address]      Group Topic/Focus: Communication, Problem Solving, Leisure Edcuation  Participation Level: Active  Participation Quality: Appropriate and Redirectable  Affect: Euthymic  Cognitive: Oriented   Additional Comments: Activity: Human Knot & Leisure Alphabet ; Explanation: Human Knot - Patients are asked to stand in a circle and join hands with the person across from them. As a group patients are then asked to untangle the knot they have created using their hands. Leisure Alphabet - Patients were given a worksheet with each letter of the alphabet, patients were then asked to identify a positive recreation/leisure activity for each letter of the alphabet.   Patient with peers successfully untangled themselves. Patient identified communication and problem solving as skills needed to be successful at this activity. Patient completed Leisure Information systems manager. Peers assisted patient with letters he was not able to identify himself. Patient participated in group discussion about using problem solving and communication to stay sober. Patient participated in discussion about using leisure and recreation to stay sober and assist with communication and problem solving skills.   Luke Dennis, LRT/CTRS   Kathleen Tamm L 07/10/2012 5:10 PM

## 2012-07-10 NOTE — Progress Notes (Signed)
Patient ID: Luke Dennis, male   DOB: September 06, 1985, 27 y.o.   MRN: [id number] Patient reports change in his plan for discharge as he now wants to be referred to [facility] for SA treatment.  Contact with [facility] who reports all treatment beds full. CSW spoke with patient and encouraged patient to follow up with [facility] which was the plan on Monday. Patient continues to report he needs continued substance abuse treatment. "I need to focus on my recovery, it is too soon for me to return to work." Patient also mentioning two court dates he has coming up in May. Patient requests CSW contact mother Luke Dennis at [phone] or [phone]. Patient also reports doctor mentioned a year long faith based program, [facility], he may be interested in. CSW provided print out of [facility] contact information, left on patient's bed as he was off floor for recreation.  Patient's mother reports she is unable to help with cost of non refundable application fee.  Carney Bern, LCSWA

## 2012-07-10 NOTE — ED Provider Notes (Signed)
Medical screening examination/treatment/procedure(s) were performed by non-physician practitioner and as supervising physician I was immediately available for consultation/collaboration.  Raeford Razor, MD 07/10/12 [phone]

## 2012-07-10 NOTE — Progress Notes (Signed)
Adult Psychoeducational Group Note  Date:  07/10/2012 Time:  11:36 AM  Group Topic/Focus:  Recovery Goals:   The focus of this group is to identify appropriate goals for recovery and establish a plan to achieve them.  Participation Level:  Did Not Attend  Participation Quality:    Affect:    Cognitive:    Insight:   Engagement in Group:    Modes of Intervention:    Additional Comments:  Pt has been in the bed all morning, pt refused to attend.  Isla Pence M 07/10/2012, 11:36 AM

## 2012-07-10 NOTE — Progress Notes (Signed)
Patient ID: Luke Dennis, male   DOB: 11-Apr-1985, 27 y.o.   MRN: [id number] [facility] MD Progress Note  07/10/2012 11:59 AM Luke Dennis  MRN:  [id number]  Subjective:  "I have a lot in my mind today. I just got this news about a court date on Monday for eviction notice. I am only 1 month behind on my rent. Now, I'm being evicted. I have another court date on the 21st of May for a speeding ticket. When I get discharged from here, I can't go back to my duplex apartment. I know my mother may allow me to come home for may be 2 days, and that is it. I would [facility], but I'm learning that they do not have beds. The Social worker could get any of the treatment centers to take me. I don't know what I'm going to do. I have a baby due to be born soon. I don't want to go to a shelter. I don't trust that I will be okay going to the shelter. I have messed up big time this time around. My stomach is feeling much better, but I still have a lot going on".  Diagnosis:  Polysubstance Dependence, Substance Induced Mood Disorder  ADL's:  Intact  Sleep: "I slept better"  Appetite:  Poor  Suicidal Ideation:  Plan:  denies Intent:  denies Means:  denies Homicidal Ideation:  Plan:  denies Intent:  denies Means:  denies  AEB (as evidenced by): Per patient's reports.  Psychiatric Specialty Exam: Review of Systems  Constitutional: Negative.   HENT: Negative.   Eyes: Negative.   Respiratory: Negative.   Cardiovascular: Negative.   Gastrointestinal: Positive for nausea and abdominal pain.  Genitourinary: Negative.   Musculoskeletal: Negative.   Skin: Negative.   Neurological: Negative.   Endo/Heme/Allergies: Negative.   Psychiatric/Behavioral: Positive for depression and substance abuse. The patient is nervous/anxious and has insomnia.     Blood pressure 107/76, pulse 118, temperature 98.6 F (37 C), temperature source Oral, resp. rate 20, height 5' 9.5" (1.765 m), weight 94.348  kg (208 lb), SpO2 99.00%.Body mass index is 30.29 kg/(m^2).  General Appearance: Fairly Groomed  Patent attorney::  Fair  Speech:  Clear and Coherent and Slow  Volume:  Decreased  Mood:  Anxious, Depressed and worried, in pain  Affect:  worried in pain  Thought Process:  Coherent and Goal Directed  Orientation:  Full (Time, Place, and Person)  Thought Content:  Rumination and worries, concerns  Suicidal Thoughts:  Yes.  without intent/plan  Homicidal Thoughts:  No  Memory:  Immediate;   Fair Recent;   Fair Remote;   Fair  Judgement:  Fair  Insight:  Shallow  Psychomotor Activity:  Restlessness  Concentration:  Fair  Recall:  Fair  Akathisia:  No  Handed:  Right  AIMS (if indicated):     Assets:  Desire for Improvement  Sleep:  Number of Hours: 6.25   Current Medications: Current Facility-Administered Medications  Medication Dose Route Frequency Provider Last Rate Last Dose  . acetaminophen (TYLENOL) tablet 650 mg  650 mg Oral Q6H PRN Rachael Fee, MD   650 mg at 07/08/12 [phone]  . alum & mag hydroxide-simeth (MAALOX/MYLANTA) 200-200-20 MG/5ML suspension 30 mL  30 mL Oral Q4H PRN Rachael Fee, MD      . benzocaine (ORAJEL) 10 % mucosal gel   Mouth/Throat QID PRN Jorje Guild, PA-C      . buPROPion [facility]  XL) 24 hr tablet 150 mg  150 mg Oral Daily Rachael Fee, MD   150 mg at 07/10/12 0830  . gabapentin (NEURONTIN) capsule 600 mg  600 mg Oral TID Rachael Fee, MD   600 mg at 07/10/12 1157  . magnesium hydroxide (MILK OF MAGNESIA) suspension 30 mL  30 mL Oral Daily PRN Rachael Fee, MD   30 mL at 07/08/12 0803  . multivitamin with minerals tablet 1 tablet  1 tablet Oral Daily Rachael Fee, MD   1 tablet at 07/10/12 0831  . nicotine (NICODERM CQ - dosed in mg/24 hours) patch 21 mg  21 mg Transdermal Daily Rachael Fee, MD   21 mg at 07/10/12 0644  . pantoprazole (PROTONIX) EC tablet 40 mg  40 mg Oral BID Sanjuana Kava, NP   40 mg at 07/10/12 0831  . sucralfate (CARAFATE) tablet 1  g  1 g Oral TID WC & HS Rachael Fee, MD   1 g at 07/10/12 1157  . sulfamethoxazole-trimethoprim (BACTRIM,SEPTRA) 400-80 MG per tablet 1 tablet  1 tablet Oral Q12H Rachael Fee, MD   1 tablet at 07/10/12 0830  . thiamine (B-1) injection 100 mg  100 mg Intramuscular Once Rachael Fee, MD      . thiamine (VITAMIN B-1) tablet 100 mg  100 mg Oral Daily Rachael Fee, MD   100 mg at 07/10/12 0831  . traZODone (DESYREL) tablet 200 mg  200 mg Oral QHS PRN Rachael Fee, MD   200 mg at 07/09/12 2126    Lab Results:  Results for orders placed during the hospital encounter of 07/05/12 (from the past 48 hour(s))  URINALYSIS, MICROSCOPIC ONLY     Status: Abnormal   Collection Time    07/09/12  2:15 PM      Result Value Range   Color, Urine YELLOW  YELLOW   APPearance CLEAR  CLEAR   Specific Gravity, Urine 1.031 (*) 1.005 - 1.030   pH 6.0  5.0 - 8.0   Glucose, UA NEGATIVE  NEGATIVE mg/dL   Hgb urine dipstick NEGATIVE  NEGATIVE   Bilirubin Urine NEGATIVE  NEGATIVE   Ketones, ur NEGATIVE  NEGATIVE mg/dL   Protein, ur NEGATIVE  NEGATIVE mg/dL   Urobilinogen, UA 0.2  0.0 - 1.0 mg/dL   Nitrite NEGATIVE  NEGATIVE   Leukocytes, UA NEGATIVE  NEGATIVE   Bacteria, UA FEW (*) RARE   Urine-Other MUCOUS PRESENT    URINE RAPID DRUG SCREEN (HOSP PERFORMED)     Status: Abnormal   Collection Time    07/09/12  4:03 PM      Result Value Range   Opiates NONE DETECTED  NONE DETECTED   Cocaine NONE DETECTED  NONE DETECTED   Benzodiazepines POSITIVE (*) NONE DETECTED   Amphetamines POSITIVE (*) NONE DETECTED   Tetrahydrocannabinol POSITIVE (*) NONE DETECTED   Barbiturates NONE DETECTED  NONE DETECTED   Comment:            DRUG SCREEN FOR MEDICAL PURPOSES     ONLY.  IF CONFIRMATION IS NEEDED     FOR ANY PURPOSE, NOTIFY LAB     WITHIN 5 DAYS.                LOWEST DETECTABLE LIMITS     FOR URINE DRUG SCREEN     Drug Class       Cutoff (ng/mL)     Amphetamine      1000  Barbiturate      200      Benzodiazepine   200     Tricyclics       300     Opiates          300     Cocaine          300     THC              50  CBC WITH DIFFERENTIAL     Status: Abnormal   Collection Time    07/09/12  4:15 PM      Result Value Range   WBC 9.4  4.0 - 10.5 K/uL   RBC 4.25  4.22 - 5.81 MIL/uL   Hemoglobin 12.9 (*) 13.0 - 17.0 g/dL   HCT 78.2  95.6 - 21.3 %   MCV 92.5  78.0 - 100.0 fL   MCH 30.4  26.0 - 34.0 pg   MCHC 32.8  30.0 - 36.0 g/dL   RDW 08.6  57.8 - 46.9 %   Platelets 223  150 - 400 K/uL   Neutrophils Relative 61  43 - 77 %   Neutro Abs 5.8  1.7 - 7.7 K/uL   Lymphocytes Relative 31  12 - 46 %   Lymphs Abs 2.9  0.7 - 4.0 K/uL   Monocytes Relative 6  3 - 12 %   Monocytes Absolute 0.6  0.1 - 1.0 K/uL   Eosinophils Relative 1  0 - 5 %   Eosinophils Absolute 0.1  0.0 - 0.7 K/uL   Basophils Relative 1  0 - 1 %   Basophils Absolute 0.1  0.0 - 0.1 K/uL  COMPREHENSIVE METABOLIC PANEL     Status: Abnormal   Collection Time    07/09/12  4:15 PM      Result Value Range   Sodium 137  135 - 145 mEq/L   Potassium 4.2  3.5 - 5.1 mEq/L   Chloride 101  96 - 112 mEq/L   CO2 28  19 - 32 mEq/L   Glucose, Bld 118 (*) 70 - 99 mg/dL   BUN 16  6 - 23 mg/dL   Creatinine, Ser 6.29  0.50 - 1.35 mg/dL   Calcium 9.3  8.4 - 52.8 mg/dL   Total Protein 6.9  6.0 - 8.3 g/dL   Albumin 3.9  3.5 - 5.2 g/dL   AST 13  0 - 37 U/L   ALT 25  0 - 53 U/L   Alkaline Phosphatase 74  39 - 117 U/L   Total Bilirubin 0.5  0.3 - 1.2 mg/dL   GFR calc non Af Amer >90  >90 mL/min   GFR calc Af Amer >90  >90 mL/min   Comment:            The eGFR has been calculated     using the CKD EPI equation.     This calculation has not been     validated in all clinical     situations.     eGFR's persistently     <90 mL/min signify     possible Chronic Kidney Disease.  LIPASE, BLOOD     Status: None   Collection Time    07/09/12  4:15 PM      Result Value Range   Lipase 25  11 - 59 U/L    Physical Findings: AIMS:  Facial and Oral Movements Muscles of Facial Expression: None, normal Lips and Perioral Area: None, normal Jaw:  None, normal Tongue: None, normal,Extremity Movements Upper (arms, wrists, hands, fingers): None, normal Lower (legs, knees, ankles, toes): None, normal, Trunk Movements Neck, shoulders, hips: None, normal, Overall Severity Severity of abnormal movements (highest score from questions above): None, normal Incapacitation due to abnormal movements: None, normal Patient's awareness of abnormal movements (rate only patient's report): No Awareness, Dental Status Current problems with teeth and/or dentures?: No Does patient usually wear dentures?: No  CIWA:  CIWA-Ar Total: 3 COWS:  COWS Total Score: 0  Treatment Plan Summary: Daily contact with patient to assess and evaluate symptoms and progress in treatment Medication management  Plan: Supportive approach/coping skills/relapse prevention. Encouraged out of room, participation in group sessions and application of coping skills when distressed. Will continue to monitor response to/adverse effects of medications in use to assure effectiveness. Continue to monitor mood, behavior and interaction with staff and other patients. Continue current plan of care.  Medical Decision Making Problem Points:  Review of last therapy session (1) and Review of psycho-social stressors (1) Data Points:  Review of medication regiment & side effects (2) Review of new medications or change in dosage (2)  I certify that inpatient services furnished can reasonably be expected to improve the patient's condition.   Armandina Stammer I 07/10/2012, 11:59 AM

## 2012-07-10 NOTE — [facility] Group Notes (Signed)
[facility] LCSW Group Therapy  07/10/2012 1:15 PM  Type of Therapy:  Group Therapy 1:15 to 2:30 PM  Participation Level:  Active  Participation Quality:  Appropriate  Affect:  Depressed  Cognitive:  Appropriate  Insight:  Developing/Improving  Engagement in Therapy:  Engaged  Modes of Intervention:  Discussion, Exploration, Socialization and Support  Summary of Progress/Problems: Patient attended last 50 minutes of group presentation by staff member of  [facility] ([facility]). Apolinar Junes was attentive and appropriate during session and he asked questions of facilitator related to what may be best for him to do at discharge.  Patient was able to process some of his frustrations regarding whether to go into long term treatment for his SA and miss his child's birth or whether to chance relapse again.   Clide Dales

## 2012-07-11 MED ORDER — METHOCARBAMOL 500 MG PO TABS
500.0000 mg | ORAL_TABLET | Freq: Three times a day (TID) | ORAL | Status: DC
  Administered 2012-07-11 – 2012-07-12 (×4): 500 mg via ORAL
  Filled 2012-07-11 (×2): qty 1
  Filled 2012-07-11: qty 42
  Filled 2012-07-11: qty 1
  Filled 2012-07-11: qty 42
  Filled 2012-07-11 (×3): qty 1
  Filled 2012-07-11: qty 42
  Filled 2012-07-11: qty 1

## 2012-07-11 MED ORDER — METHOCARBAMOL 500 MG PO TABS: 500.0000 mg | ORAL_TABLET | Freq: Three times a day (TID) | ORAL | Status: DC

## 2012-07-11 MED ORDER — OXCARBAZEPINE 300 MG PO TABS
300.0000 mg | ORAL_TABLET | Freq: Two times a day (BID) | ORAL | Status: DC
  Administered 2012-07-11 – 2012-07-12 (×2): 300 mg via ORAL
  Filled 2012-07-11 (×2): qty 1
  Filled 2012-07-11 (×2): qty 28
  Filled 2012-07-11 (×2): qty 1

## 2012-07-11 NOTE — Progress Notes (Signed)
Pt reports he is doing well.  He says his withdrawal symptoms are minimal.  He is still having passive SI thoughts, but contracts for safety.  He is pleasant/cooperative on the unit and interacts well with his peers and staff.  Pt is med compliant.  He denies AVH/HI.  He voices no complaints at this time.  He says he may go to [facility] or possibly [facility].  He appears to be vested in his treatment and wants help with his addictions.  Pt makes his needs known to staff.  Support and encouragement offered.  Safety maintained with q15 minute checks.

## 2012-07-11 NOTE — Progress Notes (Signed)
[facility] LCSW Group Therapy  07/11/2012 1:15 PM  Type of Therapy:  Group Therapy 1:15  Participation Level:  Active  Participation Quality:  Attentive and Sharing  Affect:  Flat  Cognitive:  Alert and Oriented  Insight:  Improving  Engagement in Therapy:  Monopolizing  Modes of Intervention:  Discussion, Exploration, Socialization and Support  Summary of Progress/Problems: The focus of this group session was to process how we deal with difficult emotions and share with others the patterns that play out when we are reacting to the emotion verses the situation.  Luke Dennis shared that one of the most difficult emotions he deals with is shame and guilt. Luke Dennis was able to process  How these often prolong his use and how he tends to feel really prideful or shameful; patient stated he would like to feel "normal."   Harrill, [name]

## 2012-07-11 NOTE — Progress Notes (Signed)
Adult Psychoeducational Group Note  Date:  07/11/2012 Time:  9:36 PM  Group Topic/Focus:  NA group  Participation Level:  Active  Participation Quality:  Appropriate  Affect:  Appropriate  Cognitive:  Alert  Insight: Appropriate  Engagement in Group:  Engaged  Modes of Intervention:  Discussion  Additional Comments:    Flonnie Hailstone 07/11/2012, 9:36 PM

## 2012-07-11 NOTE — Progress Notes (Signed)
Patient ID: Luke Dennis, male   DOB: 1986-01-10, 27 y.o.   MRN: [id number] He has been up and to groups interacting and joking with peers . Has requested medication for a tooth ache that was effective. Self inventory: depression 5, hopelessness 6, SI on and Off,and of pain.  He was started on new medication today.

## 2012-07-11 NOTE — Progress Notes (Addendum)
Patient ID: Luke Dennis, male   DOB: 1985-08-08, 27 y.o.   MRN: [id number] Patient ID: Luke Dennis, male   DOB: [birth year], 27 y.o.   MRN: [id number] [facility] MD Progress Note  07/11/2012 12:02 PM Luke Dennis  MRN:  [id number]  Subjective:  "I could not understand why the social worker would not allow my mother to come to my treatment team meeting. She supports me emotionally, but not financially. I have burn the bridges one time too many as far as my mother is concerned. My mother does not trust me because of my drug use, but she loves me because I'm her son. My back aches, a lot of muscle spasms. My mother has manic depression. I'm worried that I probably inherited it. I have been in denial all these while because I did not want to be on medication like my mother. I was thinking that taking medications is not so manly. I know I have something else going on. I just did not want to deal with it, yet.  I have been told that I may have mood issues going on. I'm willing to try to get on some medicine for it. The Wellbutrin is not enough as I am seeing it. I will also consider long term treatment ".  O: The wound to back area is closed. No drainage, redness and or odor present. Some dry dressing in place to cushion the wound. Will complete his antibiotic therapy tomorrow 07/12/12. No dressing changes and or wound care required.  Diagnosis:  Polysubstance Dependence, Substance Induced Mood Disorder  ADL's:  Intact  Sleep: "I slept better"  Appetite:  Poor  Suicidal Ideation:  Plan:  denies Intent:  denies Means:  denies Homicidal Ideation:  Plan:  denies Intent:  denies Means:  denies  AEB (as evidenced by): Per patient's reports.  Psychiatric Specialty Exam: Review of Systems  Constitutional: Negative.   HENT: Negative.   Eyes: Negative.   Respiratory: Negative.   Cardiovascular: Negative.   Gastrointestinal: Positive for nausea and abdominal pain.  Genitourinary: Negative.    Musculoskeletal: Negative.   Skin: Negative.   Neurological: Negative.   Endo/Heme/Allergies: Negative.   Psychiatric/Behavioral: Positive for depression and substance abuse. The patient is nervous/anxious and has insomnia.     Blood pressure 130/88, pulse 103, temperature 98.1 F (36.7 C), temperature source Oral, resp. rate 20, height 5' 9.5" (1.765 m), weight 94.348 kg (208 lb), SpO2 99.00%.Body mass index is 30.29 kg/(m^2).  General Appearance: Fairly Groomed  Patent attorney::  Fair  Speech:  Clear and Coherent and Slow  Volume:  Decreased  Mood:  Anxious, Depressed and worried, in pain  Affect:  worried in pain  Thought Process:  Coherent and Goal Directed  Orientation:  Full (Time, Place, and Person)  Thought Content:  Rumination and worries, concerns  Suicidal Thoughts:  Yes.  without intent/plan  Homicidal Thoughts:  No  Memory:  Immediate;   Fair Recent;   Fair Remote;   Fair  Judgement:  Fair  Insight:  Shallow  Psychomotor Activity:  Restlessness  Concentration:  Fair  Recall:  Fair  Akathisia:  No  Handed:  Right  AIMS (if indicated):     Assets:  Desire for Improvement  Sleep:  Number of Hours: 6.25   Current Medications: Current Facility-Administered Medications  Medication Dose Route Frequency Provider Last Rate Last Dose  . acetaminophen (TYLENOL) tablet 650 mg  650 mg Oral Q6H PRN Rachael Fee, MD   [phone]  mg at 07/10/12 1812  . alum & mag hydroxide-simeth (MAALOX/MYLANTA) 200-200-20 MG/5ML suspension 30 mL  30 mL Oral Q4H PRN Rachael Fee, MD      . benzocaine (ORAJEL) 10 % mucosal gel   Mouth/Throat QID PRN Jorje Guild, PA-C      . buPROPion (WELLBUTRIN XL) 24 hr tablet 150 mg  150 mg Oral Daily Rachael Fee, MD   150 mg at 07/11/12 0748  . gabapentin (NEURONTIN) capsule 600 mg  600 mg Oral TID Rachael Fee, MD   600 mg at 07/11/12 1146  . magnesium hydroxide (MILK OF MAGNESIA) suspension 30 mL  30 mL Oral Daily PRN Rachael Fee, MD   30 mL at 07/08/12 0803   . methocarbamol (ROBAXIN) tablet 500 mg  500 mg Oral Q8H PRN Rachael Fee, MD   500 mg at 07/11/12 [phone]  . multivitamin with minerals tablet 1 tablet  1 tablet Oral Daily Rachael Fee, MD   1 tablet at 07/11/12 [phone]  . nicotine (NICODERM CQ - dosed in mg/24 hours) patch 21 mg  21 mg Transdermal Daily Rachael Fee, MD   21 mg at 07/11/12 0609  . pantoprazole (PROTONIX) EC tablet 40 mg  40 mg Oral BID Sanjuana Kava, NP   40 mg at 07/11/12 0749  . sucralfate (CARAFATE) tablet 1 g  1 g Oral TID WC & HS Rachael Fee, MD   1 g at 07/11/12 1146  . sulfamethoxazole-trimethoprim (BACTRIM,SEPTRA) 400-80 MG per tablet 1 tablet  1 tablet Oral Q12H Rachael Fee, MD   1 tablet at 07/10/12 2122  . thiamine (B-1) injection 100 mg  100 mg Intramuscular Once Rachael Fee, MD      . thiamine (VITAMIN B-1) tablet 100 mg  100 mg Oral Daily Rachael Fee, MD   100 mg at 07/11/12 0748  . traZODone (DESYREL) tablet 200 mg  200 mg Oral QHS PRN Rachael Fee, MD   200 mg at 07/10/12 2119    Lab Results:  Results for orders placed during the hospital encounter of 07/05/12 (from the past 48 hour(s))  URINALYSIS, MICROSCOPIC ONLY     Status: Abnormal   Collection Time    07/09/12  2:15 PM      Result Value Range   Color, Urine YELLOW  YELLOW   APPearance CLEAR  CLEAR   Specific Gravity, Urine 1.031 (*) 1.005 - 1.030   pH 6.0  5.0 - 8.0   Glucose, UA NEGATIVE  NEGATIVE mg/dL   Hgb urine dipstick NEGATIVE  NEGATIVE   Bilirubin Urine NEGATIVE  NEGATIVE   Ketones, ur NEGATIVE  NEGATIVE mg/dL   Protein, ur NEGATIVE  NEGATIVE mg/dL   Urobilinogen, UA 0.2  0.0 - 1.0 mg/dL   Nitrite NEGATIVE  NEGATIVE   Leukocytes, UA NEGATIVE  NEGATIVE   Bacteria, UA FEW (*) RARE   Urine-Other MUCOUS PRESENT    URINE RAPID DRUG SCREEN (HOSP PERFORMED)     Status: Abnormal   Collection Time    07/09/12  4:03 PM      Result Value Range   Opiates NONE DETECTED  NONE DETECTED   Cocaine NONE DETECTED  NONE DETECTED    Benzodiazepines POSITIVE (*) NONE DETECTED   Amphetamines POSITIVE (*) NONE DETECTED   Tetrahydrocannabinol POSITIVE (*) NONE DETECTED   Barbiturates NONE DETECTED  NONE DETECTED   Comment:            DRUG SCREEN FOR  MEDICAL PURPOSES     ONLY.  IF CONFIRMATION IS NEEDED     FOR ANY PURPOSE, NOTIFY LAB     WITHIN 5 DAYS.                LOWEST DETECTABLE LIMITS     FOR URINE DRUG SCREEN     Drug Class       Cutoff (ng/mL)     Amphetamine      1000     Barbiturate      200     Benzodiazepine   200     Tricyclics       300     Opiates          300     Cocaine          300     THC              50  CBC WITH DIFFERENTIAL     Status: Abnormal   Collection Time    07/09/12  4:15 PM      Result Value Range   WBC 9.4  4.0 - 10.5 K/uL   RBC 4.25  4.22 - 5.81 MIL/uL   Hemoglobin 12.9 (*) 13.0 - 17.0 g/dL   HCT 65.7  84.6 - 96.2 %   MCV 92.5  78.0 - 100.0 fL   MCH 30.4  26.0 - 34.0 pg   MCHC 32.8  30.0 - 36.0 g/dL   RDW 95.2  84.1 - 32.4 %   Platelets 223  150 - 400 K/uL   Neutrophils Relative 61  43 - 77 %   Neutro Abs 5.8  1.7 - 7.7 K/uL   Lymphocytes Relative 31  12 - 46 %   Lymphs Abs 2.9  0.7 - 4.0 K/uL   Monocytes Relative 6  3 - 12 %   Monocytes Absolute 0.6  0.1 - 1.0 K/uL   Eosinophils Relative 1  0 - 5 %   Eosinophils Absolute 0.1  0.0 - 0.7 K/uL   Basophils Relative 1  0 - 1 %   Basophils Absolute 0.1  0.0 - 0.1 K/uL  COMPREHENSIVE METABOLIC PANEL     Status: Abnormal   Collection Time    07/09/12  4:15 PM      Result Value Range   Sodium 137  135 - 145 mEq/L   Potassium 4.2  3.5 - 5.1 mEq/L   Chloride 101  96 - 112 mEq/L   CO2 28  19 - 32 mEq/L   Glucose, Bld 118 (*) 70 - 99 mg/dL   BUN 16  6 - 23 mg/dL   Creatinine, Ser 4.01  0.50 - 1.35 mg/dL   Calcium 9.3  8.4 - 02.7 mg/dL   Total Protein 6.9  6.0 - 8.3 g/dL   Albumin 3.9  3.5 - 5.2 g/dL   AST 13  0 - 37 U/L   ALT 25  0 - 53 U/L   Alkaline Phosphatase 74  39 - 117 U/L   Total Bilirubin 0.5  0.3 - 1.2 mg/dL    GFR calc non Af Amer >90  >90 mL/min   GFR calc Af Amer >90  >90 mL/min   Comment:            The eGFR has been calculated     using the CKD EPI equation.     This calculation has not been     validated in all clinical  situations.     eGFR's persistently     <90 mL/min signify     possible Chronic Kidney Disease.  LIPASE, BLOOD     Status: None   Collection Time    07/09/12  4:15 PM      Result Value Range   Lipase 25  11 - 59 U/L    Physical Findings: AIMS: Facial and Oral Movements Muscles of Facial Expression: None, normal Lips and Perioral Area: None, normal Jaw: None, normal Tongue: None, normal,Extremity Movements Upper (arms, wrists, hands, fingers): None, normal Lower (legs, knees, ankles, toes): None, normal, Trunk Movements Neck, shoulders, hips: None, normal, Overall Severity Severity of abnormal movements (highest score from questions above): None, normal Incapacitation due to abnormal movements: None, normal Patient's awareness of abnormal movements (rate only patient's report): No Awareness, Dental Status Current problems with teeth and/or dentures?: No Does patient usually wear dentures?: No  CIWA:  CIWA-Ar Total: 0 COWS:  COWS Total Score: 0  Treatment Plan Summary: Daily contact with patient to assess and evaluate symptoms and progress in treatment Medication management  Plan: Supportive approach/coping skills/relapse prevention. Restarted Robaxin 500 mg tid for muscle pain/spasms. Initiate Trileptal 300 mg bid for mood stabilization Encouraged out of room, participation in group sessions and application of coping skills when distressed. Will continue to monitor response to/adverse effects of medications in use to assure effectiveness. Continue to monitor mood, behavior and interaction with staff and other patients. Continue current plan of care.  Medical Decision Making Problem Points:  Review of last therapy session (1) and Review of  psycho-social stressors (1) Data Points:  Review of medication regiment & side effects (2) Review of new medications or change in dosage (2)  I certify that inpatient services furnished can reasonably be expected to improve the patient's condition.   Armandina Stammer I 07/11/2012, 12:02 PM

## 2012-07-11 NOTE — Tx Team (Signed)
Interdisciplinary Treatment Plan Update (Adult)  Date: 07/11/2012  Time Reviewed: 10:13 AM   Progress in Treatment: Attending groups: Yes Participating in groups: Yes Taking medication as prescribed:  Yes Tolerating medication:  Yes Family/Significant othe contact made: Yes Patient understands diagnosis: Yes Discussing patient identified problems/goals with staff: Yes Medical problems stabilized or resolved:  Yes Denies suicidal/homicidal ideation: Yes Patient has not harmed self or Others: Yes  New problem(s) identified: None Identified  Discharge Plan or Barriers:  CSW will re-refer to [facility] once there is medical notation regarding wound. Patient denied at [facility] and is now exploring [facility] by calling and speaking to director of admissions.   Additional comments: N/A  Reason for Continuation of Hospitalization Medication stabilization Withdrawal symptoms  Estimated length of stay:1-2 days  For review of initial/current patient goals, please see plan of care.  Attendees: Patient:     Family:     Physician:  Geoffery Lyons 07/11/2012 10:13 AM   Nursing:   Roswell Miners, RN 07/11/2012 10:13 AM   Clinical Social Worker Ronda Fairly 07/11/2012 10:13 AM   Other:  Dorinda Hill, [place] PA Student 07/11/2012 10:13 AM   Other:  Jacolyn Reedy Psych Student 07/11/2012 10:13 AM   Other:  Serena Colonel, PA 07/11/2012 10:13 AM   Other:  Carney Living,  07/11/2012 10:13 AM    Scribe for Treatment Team:   Carney Bern, LCSWA  07/11/2012 10:13 AM

## 2012-07-11 NOTE — BHH Group Notes (Signed)
[facility] LCSW Aftercare Discharge Planning Group Note   07/11/2012 8:45 AM  Participation Quality:  Appropriate  Mood/Affect:  Flat  Depression Rating:  4  Anxiety Rating:  6  Thoughts of Suicide:  No Will you contract for safety?   NA  Current AVH:  No  Plan for Discharge/Comments:  Patient wishes to discharge to inpatient program and has been referred to [facility] and given contact information to [facility] to speak with assessment coordinator. CSW to contact [facility].   Transportation Means: [facility] or family  Supports: Family and girlfriend  Dyane Dustman, [name]

## 2012-07-11 NOTE — Progress Notes (Signed)
Patient ID: Luke Dennis, male   DOB: 1985/11/28, 27 y.o.   MRN: [id number]   D: Pt was pleasant and cooperative. Stated that he was concerned about placement. Pt stated that because of his background "they're" having trouble placing him. Stated that his SW "just wants him to do what she wants". Pt stated that his baby will be born in Aug, but that he realizes he has to take care of himself first. Stated that he's willing to do a year program.  States he was told about "[facility]". Pt wants that option but stated that he was informed that he may have to do an [employer].  A:  Support and encouragement was offered. 15 min checks continued for safety.  R: Pt remains safe.

## 2012-07-12 MED ORDER — OXCARBAZEPINE 300 MG PO TABS: 300.0000 mg | ORAL_TABLET | Freq: Two times a day (BID) | ORAL | Status: DC

## 2012-07-12 MED ORDER — DICYCLOMINE HCL 20 MG PO TABS
20.0000 mg | ORAL_TABLET | Freq: Two times a day (BID) | ORAL | Status: DC
  Filled 2012-07-12 (×3): qty 28

## 2012-07-12 MED ORDER — ONDANSETRON 4 MG PO TBDP
4.0000 mg | ORAL_TABLET | Freq: Three times a day (TID) | ORAL | Status: DC | PRN
  Filled 2012-07-12: qty 1

## 2012-07-12 MED ORDER — IBUPROFEN 600 MG PO TABS
600.0000 mg | ORAL_TABLET | Freq: Four times a day (QID) | ORAL | Status: DC | PRN
Start: 2012-07-12 — End: 2012-07-12
  Administered 2012-07-12: 600 mg via ORAL
  Filled 2012-07-12: qty 1

## 2012-07-12 MED ORDER — TRAZODONE HCL 100 MG PO TABS: 200.0000 mg | ORAL_TABLET | Freq: Every evening | ORAL | Status: DC | PRN

## 2012-07-12 MED ORDER — BUPROPION HCL ER (XL) 150 MG PO TB24: 150.0000 mg | ORAL_TABLET | Freq: Every day | ORAL | Status: DC

## 2012-07-12 MED ORDER — GABAPENTIN 300 MG PO CAPS: 600.0000 mg | ORAL_CAPSULE | Freq: Three times a day (TID) | ORAL | Status: DC

## 2012-07-12 MED ORDER — GABAPENTIN 600 MG PO TABS
600.0000 mg | ORAL_TABLET | Freq: Three times a day (TID) | ORAL | Status: DC
  Filled 2012-07-12: qty 42

## 2012-07-12 MED ORDER — METHOCARBAMOL 500 MG PO TABS: 500.0000 mg | ORAL_TABLET | Freq: Three times a day (TID) | ORAL | Status: DC

## 2012-07-12 MED ORDER — SUCRALFATE 1 G PO TABS: 1.0000 g | ORAL_TABLET | Freq: Three times a day (TID) | ORAL | Status: DC

## 2012-07-12 NOTE — Progress Notes (Signed)
[facility] Adult Case Management Discharge Plan :  Will you be returning to the same living situation after discharge: No. Patient discharging to [facility] today At discharge, do you have transportation home?:Yes,  [facility] will transport Do you have the ability to pay for your medications:Yes,  through [facility]  Release of information consent forms completed and in the chart;  Patient's signature needed at discharge.  Patient to Follow up at: Follow-up Information   Schedule an appointment as soon as possible for a visit with [facility]. (As needed if symptoms worsen)    Contact information:   [address] [place] [address] [phone]      Follow up with [facility] On 07/12/2012. ([facility] will provide transportation today as needed)    Contact information:   [address] [place], [place] [address] Ph [phone] Valinda Hoar [phone]      Patient denies SI/HI:   Yes,  denies both    Safety Planning and Suicide Prevention discussed:  Yes,  with mother  Clide Dales 07/12/2012, 6:46 PM

## 2012-07-12 NOTE — BHH Group Notes (Signed)
[facility] LCSW Aftercare Discharge Planning Group Note   07/12/2012 8:45 AM  Participation Quality:  Appropriate  Mood/Affect:  Appropriate  Depression Rating:  5  Anxiety Rating:  4  Thoughts of Suicide:  No Will you contract for safety?   NA  Current AVH:  No  Plan for Discharge/Comments:  Patient has been referred to [facility] and updated information sent 4/30 regarding wound that has healed; CSW to follow up and see if admit is possible. Patient reports after more consideration [facility] is not an option as he is not willing to abide by guide lines.   Transportation Means:  Family or [facility]  Supports: Family  Luke Dennis, Luke Dennis

## 2012-07-12 NOTE — Progress Notes (Signed)
Patient ID: Luke Dennis, male   DOB: [birth year], 27 y.o.   MRN: [id number] He has been picked up by [facility] staff.  He voiced understanding of discharge. He denies thoughts of SI and all belonging was taken home with him. The wound on his mid back shows no sign or symptoms of infection  There is no drainage, He stated that he was going to stay clean so that he get back together with his GF and sone to be child.

## 2012-07-12 NOTE — Progress Notes (Signed)
Patient ID: Luke Dennis, male   DOB: Mar 06, 1986, 27 y.o.   MRN: [id number] He has been c/o mid abdominal pain and having dry heaves, said it was d/t the pain. He was seen and there are new orders. IBP given but he refused the offer of Zofran. He did go down for noon meal.

## 2012-07-12 NOTE — BHH Suicide Risk Assessment (Signed)
Suicide Risk Assessment  Discharge Assessment     Demographic Factors:  Male and Caucasian  Mental Status Per Nursing Assessment::   On Admission:  NA  Current Mental Status by Physician: In full contact with reality. There are no suicidal ideas, plans or intent. His mood is euthymic, his affect is appropriate. He is willing and motivated to pursue further residential treatment and work on long term abstinence   Loss Factors: Loss of significant relationship and Financial problems/change in socioeconomic status  Historical Factors: NA  Risk Reduction Factors:   Sense of responsibility to family  Continued Clinical Symptoms:  Depression:   Comorbid alcohol abuse/dependence Alcohol/Substance Abuse/Dependencies  Cognitive Features That Contribute To Risk:  Thought constriction (tunnel vision)    Suicide Risk:  Minimal: No identifiable suicidal ideation.  Patients presenting with no risk factors but with morbid ruminations; may be classified as minimal risk based on the severity of the depressive symptoms  Discharge Diagnoses:   AXIS I:  Polysubstance Dependence, Mood Disorder NOS AXIS II:  Deferred AXIS III:   Past Medical History  Diagnosis Date  . Mental disorder   . Depression   . Anxiety    AXIS IV:  other psychosocial or environmental problems AXIS V:  61-70 mild symptoms  Plan Of Care/Follow-up recommendations:  Activity:  as tolerated Diet:  regular Will be going to [facility] today Is patient on multiple antipsychotic therapies at discharge:  No   Has Patient had three or more failed trials of antipsychotic monotherapy by history:  No  Recommended Plan for Multiple Antipsychotic Therapies: N/A   Reshonda Koerber A 07/12/2012, 1:59 PM
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
## 2012-07-17 NOTE — Progress Notes (Signed)
Patient Discharge Instructions:  After Visit Summary (AVS):   Faxed to:  07/17/12 Discharge Summary Note:   Faxed to:  07/17/12 Psychiatric Admission Assessment Note:   Faxed to:  07/17/12 Suicide Risk Assessment - Discharge Assessment:   Faxed to:  07/17/12 Faxed/Sent to the Next Level Care provider:  07/17/12 Faxed to [facility] @ [phone]  Jerelene Redden, 07/17/2012, 4:09 PM

## 2014-05-30 IMAGING — CR DG ABDOMEN ACUTE W/ 1V CHEST
3 series · 3 of 3 positions shown · non-contrast
Comparison: None.

CLINICAL DATA: 27-year-old male right upper quadrant abdominal
pain.

ACUTE ABDOMEN SERIES (ABDOMEN 2 VIEW & CHEST 1 VIEW)

[w chest pa]
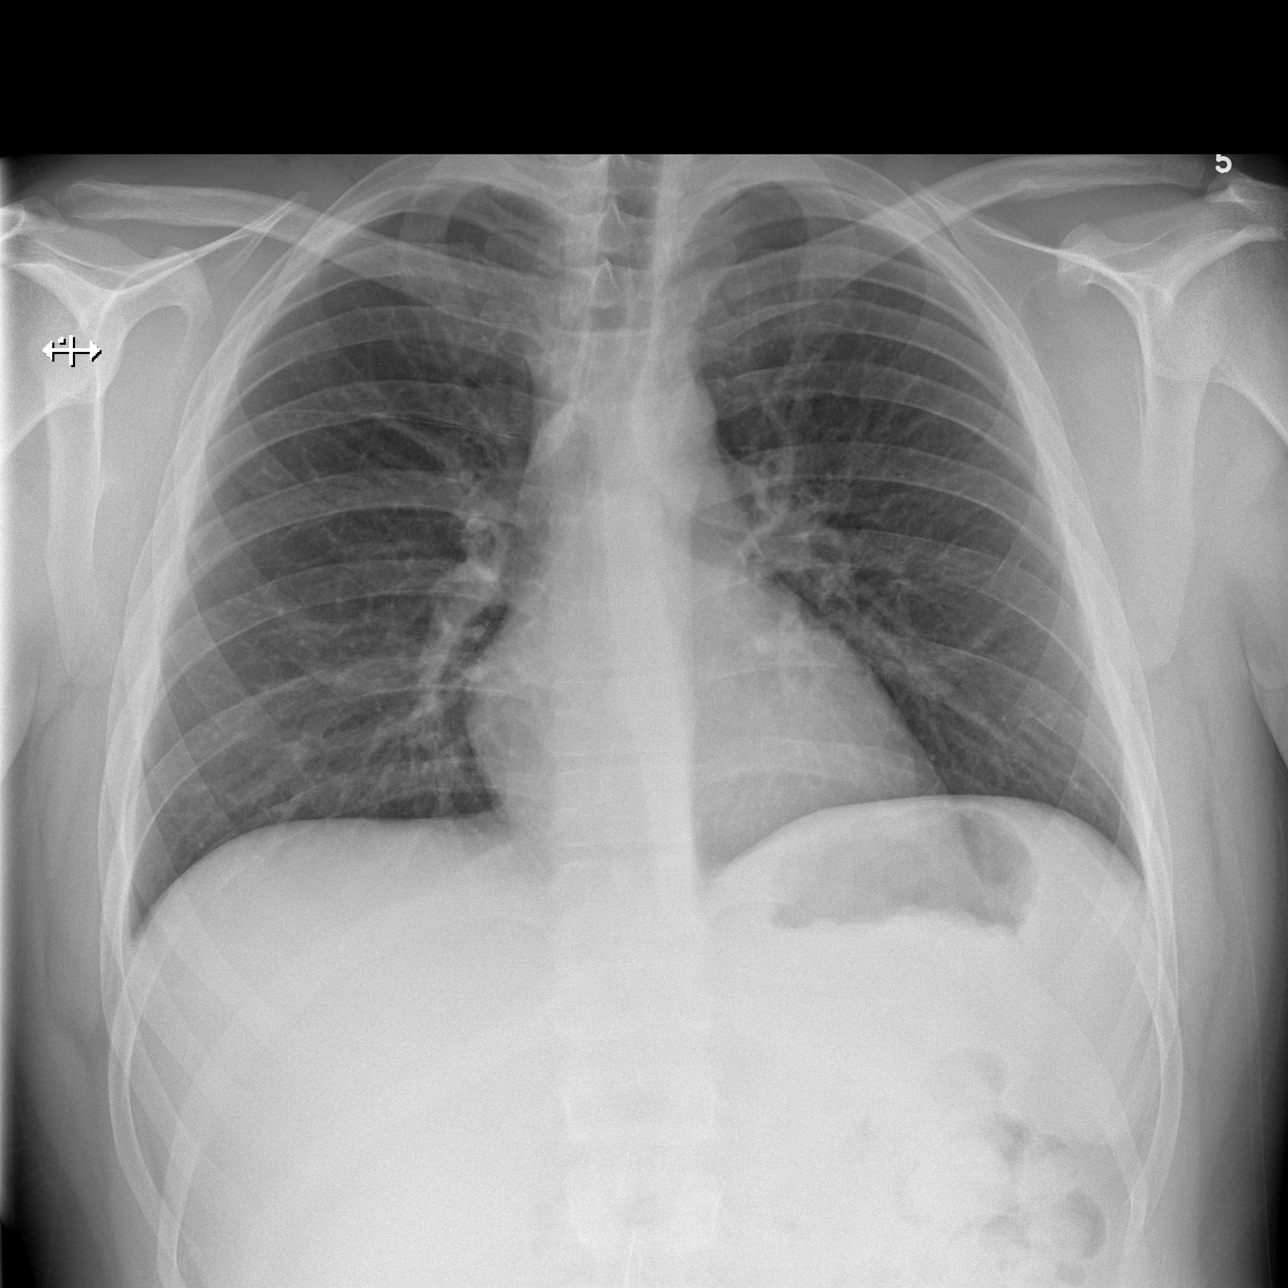

[t abdomen supine]
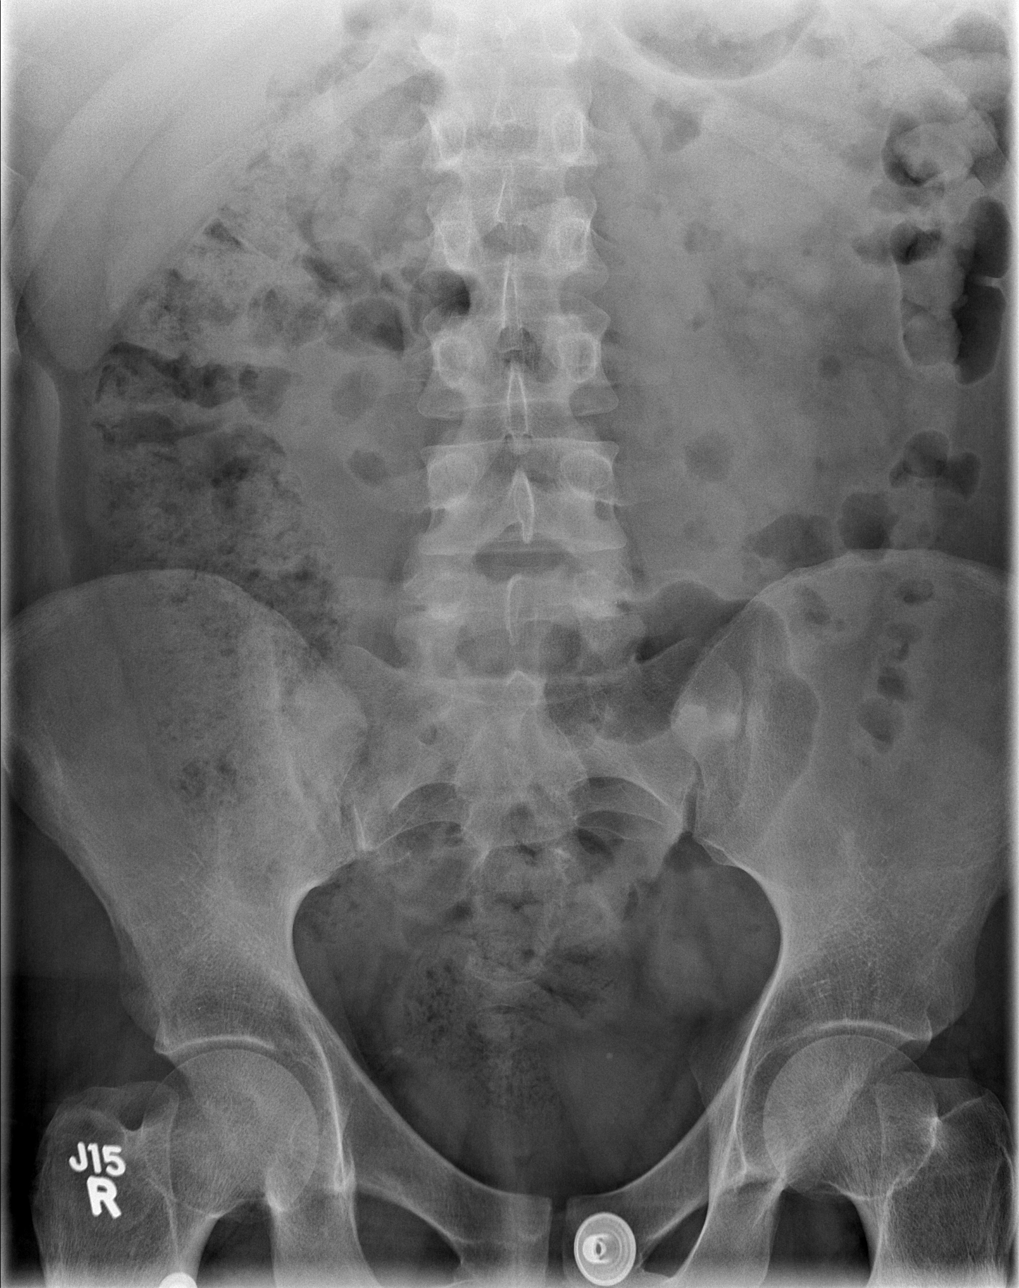

[w abdomen upright]
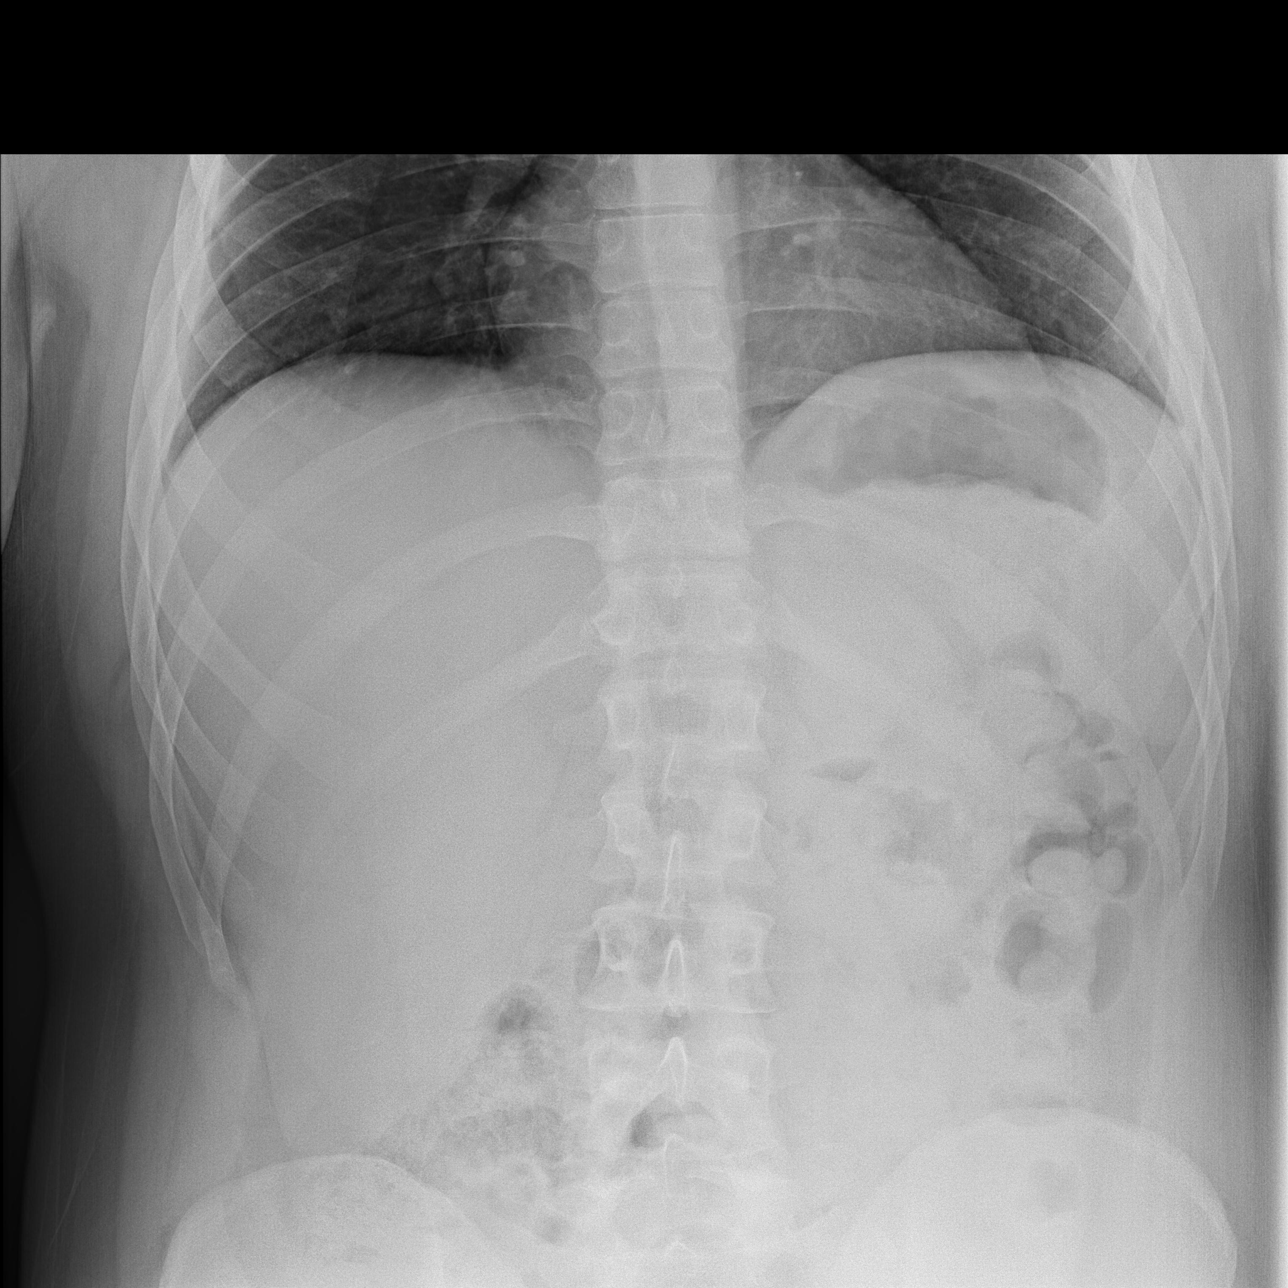

[3 of 3 positions shown; findings below may reference images not displayed]

FINDINGS: Low normal lung volumes. Normal cardiac size and
mediastinal contours.  Visualized tracheal air column is within
normal limits.  The lungs are clear.  No pneumothorax or
pneumoperitoneum.

Nonobstructed bowel gas pattern.  Abdominal and pelvic visceral
contours are within normal limits.  No acute osseous abnormality.
IMPRESSION: Nonobstructed bowel gas pattern, no free air.  Negative chest.

## 2019-08-28 ENCOUNTER — Ambulatory Visit: Payer: Self-pay | Admitting: Physician Assistant

## 2019-09-19 ENCOUNTER — Ambulatory Visit: Payer: Self-pay | Admitting: Physician Assistant

## 2019-09-19 ENCOUNTER — Encounter: Payer: Self-pay | Admitting: Physician Assistant

## 2019-09-19 VITALS — BP 120/86 | HR 100 | Temp 98.2°F | Ht 71.75 in | Wt 216.8 lb

## 2019-09-19 DIAGNOSIS — F1911 Other psychoactive substance abuse, in remission: Secondary | ICD-10-CM

## 2019-09-19 DIAGNOSIS — F17200 Nicotine dependence, unspecified, uncomplicated: Secondary | ICD-10-CM

## 2019-09-19 DIAGNOSIS — Z7689 Persons encountering health services in other specified circumstances: Secondary | ICD-10-CM

## 2019-09-19 NOTE — Progress Notes (Signed)
**Note Luke-Identified via Obfuscation** BP 120/86   Pulse 100   Temp 98.2 F (36.8 C)   Ht 5' 11.75" (1.822 m)   Wt 216 lb 12 oz (98.3 kg)   SpO2 99%   BMI 29.60 kg/m    Subjective:    Patient ID: Luke Dennis, male    DOB: 08/18/1985, 34 y.o.   MRN: [id number]  HPI: Luke Dennis is a 34 y.o. male presenting on 09/19/2019 for New Patient (Initial Visit)   HPI    Pt had a negative covid 19 screening questionnaire.    Pt is a 34yoM who presents to establish care.   Pt was living in [place] prior to going into [facility] house about a month ago.  He is going to [facility].  Pt reports Last IVDU about 12 year ago.  He says he was staying off substance abuse when covid came and AA/NA meetings shut down.    Pt says he is doing well and has no complaints.   He denies anxiety/depression; he says he just had that due to drug use.   He has not yet gotten covid vaccination but has appointment to do so.      Relevant past medical, surgical, family and social history reviewed and updated as indicated. Interim medical history since our last visit reviewed. Allergies and medications reviewed and updated.  No current outpatient medications on file.    Review of Systems  Per HPI unless specifically indicated above     Objective:    BP 120/86   Pulse 100   Temp 98.2 F (36.8 C)   Ht 5' 11.75" (1.822 m)   Wt 216 lb 12 oz (98.3 kg)   SpO2 99%   BMI 29.60 kg/m   Wt Readings from Last 3 Encounters:  09/19/19 216 lb 12 oz (98.3 kg)  04/15/12 220 lb (99.8 kg)    Physical Exam Vitals reviewed.  Constitutional:      General: He is not in acute distress.    Appearance: Normal appearance. He is well-developed. He is not toxic-appearing.  HENT:     Head: Normocephalic and atraumatic.  Eyes:     Extraocular Movements: Extraocular movements intact.     Conjunctiva/sclera: Conjunctivae normal.     Pupils: Pupils are equal, round, and reactive to light.  Neck:     Thyroid: No thyromegaly.  Cardiovascular:      Rate and Rhythm: Normal rate and regular rhythm.  Pulmonary:     Effort: Pulmonary effort is normal.     Breath sounds: Normal breath sounds. No wheezing or rales.  Abdominal:     General: Bowel sounds are normal.     Palpations: Abdomen is soft. There is no mass.     Tenderness: There is no abdominal tenderness.  Musculoskeletal:     Cervical back: Neck supple.     Right lower leg: No edema.     Left lower leg: No edema.  Lymphadenopathy:     Cervical: No cervical adenopathy.  Skin:    General: Skin is warm and dry.     Findings: No rash.  Neurological:     Mental Status: He is alert and oriented to person, place, and time.     Motor: No weakness or tremor.     Gait: Gait is intact. Gait normal.     Deep Tendon Reflexes:     Reflex Scores:      Patellar reflexes are 2+ on the right side and 2+ on the left  side. Psychiatric:        Attention and Perception: Attention normal.        Mood and Affect: Mood normal.        Speech: Speech normal.        Behavior: Behavior normal. Behavior is cooperative.     No results found for this or any previous visit.    Assessment & Plan:     Encounter Diagnoses  Name Primary?  . Encounter to establish care Yes  . Substance abuse in remission (HCC)   . Tobacco use disorder      -Reviewed labs done late May at [facility] in [place]. No additional testing indicated at this time.  -encouraged pt to get covid vaccine as scheduled -pt to continue with [facility] for help with substance abuse -pt to follow up in 1 year.   He is to contact office sooner prn

## 2020-08-31 ENCOUNTER — Ambulatory Visit: Payer: Self-pay | Admitting: Physician Assistant

## 2020-09-08 ENCOUNTER — Encounter: Payer: Self-pay | Admitting: Physician Assistant

## 2022-08-20 ENCOUNTER — Other Ambulatory Visit: Payer: Self-pay

## 2022-08-20 ENCOUNTER — Encounter (HOSPITAL_COMMUNITY): Payer: Self-pay | Admitting: *Deleted

## 2022-08-20 ENCOUNTER — Emergency Department (HOSPITAL_COMMUNITY): Payer: PRIVATE HEALTH INSURANCE

## 2022-08-20 ENCOUNTER — Inpatient Hospital Stay (HOSPITAL_COMMUNITY)
Admission: EM | Admit: 2022-08-20 | Discharge: 2022-08-23 | DRG: 419 | Disposition: A | Discharge status: Home / Self Care | Payer: PRIVATE HEALTH INSURANCE | Attending: General Surgery | Admitting: General Surgery

## 2022-08-20 DIAGNOSIS — F17210 Nicotine dependence, cigarettes, uncomplicated: Secondary | ICD-10-CM | POA: Diagnosis present

## 2022-08-20 DIAGNOSIS — R030 Elevated blood-pressure reading, without diagnosis of hypertension: Secondary | ICD-10-CM | POA: Diagnosis present

## 2022-08-20 DIAGNOSIS — Z720 Tobacco use: Secondary | ICD-10-CM | POA: Insufficient documentation

## 2022-08-20 DIAGNOSIS — E860 Dehydration: Secondary | ICD-10-CM | POA: Diagnosis present

## 2022-08-20 DIAGNOSIS — Z6829 Body mass index (BMI) 29.0-29.9, adult: Secondary | ICD-10-CM | POA: Diagnosis not present

## 2022-08-20 DIAGNOSIS — D751 Secondary polycythemia: Secondary | ICD-10-CM | POA: Diagnosis present

## 2022-08-20 DIAGNOSIS — K810 Acute cholecystitis: Secondary | ICD-10-CM | POA: Diagnosis present

## 2022-08-20 DIAGNOSIS — F418 Other specified anxiety disorders: Secondary | ICD-10-CM | POA: Diagnosis not present

## 2022-08-20 DIAGNOSIS — K819 Cholecystitis, unspecified: Principal | ICD-10-CM

## 2022-08-20 DIAGNOSIS — K8012 Calculus of gallbladder with acute and chronic cholecystitis without obstruction: Secondary | ICD-10-CM | POA: Diagnosis present

## 2022-08-20 DIAGNOSIS — K8000 Calculus of gallbladder with acute cholecystitis without obstruction: Secondary | ICD-10-CM | POA: Diagnosis not present

## 2022-08-20 DIAGNOSIS — E663 Overweight: Secondary | ICD-10-CM | POA: Diagnosis present

## 2022-08-20 LAB — COMPREHENSIVE METABOLIC PANEL
ALT: 49 U/L — ABNORMAL HIGH (ref 0–44)
AST: 22 U/L (ref 15–41)
Albumin: 4.7 g/dL (ref 3.5–5.0)
Alkaline Phosphatase: 85 U/L (ref 38–126)
Anion gap: 11 (ref 5–15)
BUN: 17 mg/dL (ref 6–20)
CO2: 25 mmol/L (ref 22–32)
Calcium: 10.2 mg/dL (ref 8.9–10.3)
Chloride: 100 mmol/L (ref 98–111)
Creatinine, Ser: 0.93 mg/dL (ref 0.61–1.24)
GFR, Estimated: >60 mL/min (ref >60)
Glucose, Bld: 109 mg/dL — ABNORMAL HIGH (ref 70–99)
Potassium: 3.8 mmol/L (ref 3.5–5.1)
Sodium: 136 mmol/L (ref 135–145)
Total Bilirubin: 1.6 mg/dL — ABNORMAL HIGH (ref 0.3–1.2)
Total Protein: 8 g/dL (ref 6.5–8.1)

## 2022-08-20 LAB — CBC
HCT: 49.6 % (ref 39.0–52.0)
Hemoglobin: 17.2 g/dL — ABNORMAL HIGH (ref 13.0–17.0)
MCH: 31.4 pg (ref 26.0–34.0)
MCHC: 34.7 g/dL (ref 30.0–36.0)
MCV: 90.5 fL (ref 80.0–100.0)
Platelets: 210 10*3/uL (ref 150–400)
RBC: 5.48 MIL/uL (ref 4.22–5.81)
RDW: 12.4 % (ref 11.5–15.5)
WBC: 15.2 10*3/uL — ABNORMAL HIGH (ref 4.0–10.5)
nRBC: 0 % (ref 0.0–0.2)

## 2022-08-20 LAB — LIPASE, BLOOD: Lipase: 27 U/L (ref 11–51)

## 2022-08-20 MED ORDER — FENTANYL CITRATE PF 50 MCG/ML IJ SOSY
50.0000 ug | PREFILLED_SYRINGE | Freq: Once | INTRAMUSCULAR | Status: AC
  Administered 2022-08-20: 50 ug via INTRAVENOUS
  Filled 2022-08-20: qty 1

## 2022-08-20 MED ORDER — MORPHINE SULFATE (PF) 4 MG/ML IV SOLN
4.0000 mg | Freq: Once | INTRAVENOUS | Status: AC
  Administered 2022-08-20: 4 mg via INTRAVENOUS
  Filled 2022-08-20: qty 1

## 2022-08-20 MED ORDER — ONDANSETRON HCL 4 MG/2ML IJ SOLN
4.0000 mg | Freq: Once | INTRAMUSCULAR | Status: AC
  Administered 2022-08-20: 4 mg via INTRAVENOUS
  Filled 2022-08-20: qty 2

## 2022-08-20 MED ORDER — SENNOSIDES-DOCUSATE SODIUM 8.6-50 MG PO TABS
1.0000 | ORAL_TABLET | Freq: Two times a day (BID) | ORAL | Status: DC
  Administered 2022-08-20 – 2022-08-22 (×4): 1 via ORAL
  Filled 2022-08-20 (×6): qty 1

## 2022-08-20 MED ORDER — NICOTINE 14 MG/24HR TD PT24
14.0000 mg | MEDICATED_PATCH | Freq: Every day | TRANSDERMAL | Status: DC
  Administered 2022-08-20 – 2022-08-23 (×4): 14 mg via TRANSDERMAL
  Filled 2022-08-20 (×4): qty 1

## 2022-08-20 MED ORDER — OXYCODONE HCL 5 MG PO TABS
5.0000 mg | ORAL_TABLET | ORAL | Status: DC | PRN
  Administered 2022-08-20 – 2022-08-23 (×5): 5 mg via ORAL
  Filled 2022-08-20 (×5): qty 1

## 2022-08-20 MED ORDER — METRONIDAZOLE 500 MG/100ML IV SOLN
500.0000 mg | Freq: Two times a day (BID) | INTRAVENOUS | Status: DC
  Administered 2022-08-20 – 2022-08-23 (×6): 500 mg via INTRAVENOUS
  Filled 2022-08-20 (×6): qty 100

## 2022-08-20 MED ORDER — HYDROMORPHONE HCL 1 MG/ML IJ SOLN: 1.0000 mg | Freq: Once | INTRAMUSCULAR | Status: DC

## 2022-08-20 MED ORDER — SODIUM CHLORIDE 0.9 % IV SOLN
2.0000 g | INTRAVENOUS | Status: DC
  Administered 2022-08-21 – 2022-08-22 (×2): 2 g via INTRAVENOUS
  Filled 2022-08-20 (×3): qty 20

## 2022-08-20 MED ORDER — SODIUM CHLORIDE 0.9 % IV SOLN
2.0000 g | Freq: Once | INTRAVENOUS | Status: AC
  Administered 2022-08-20: 2 g via INTRAVENOUS
  Filled 2022-08-20: qty 20

## 2022-08-20 MED ORDER — MORPHINE SULFATE (PF) 2 MG/ML IV SOLN
2.0000 mg | INTRAVENOUS | Status: DC | PRN
  Administered 2022-08-20 – 2022-08-21 (×2): 2 mg via INTRAVENOUS
  Filled 2022-08-20 (×2): qty 1

## 2022-08-20 MED ORDER — IOHEXOL 300 MG/ML  SOLN
100.0000 mL | Freq: Once | INTRAMUSCULAR | Status: AC | PRN
  Administered 2022-08-20: 100 mL via INTRAVENOUS

## 2022-08-20 MED ORDER — ENOXAPARIN SODIUM 40 MG/0.4ML IJ SOSY
40.0000 mg | PREFILLED_SYRINGE | INTRAMUSCULAR | Status: DC
  Administered 2022-08-20 – 2022-08-22 (×3): 40 mg via SUBCUTANEOUS
  Filled 2022-08-20 (×3): qty 0.4

## 2022-08-20 MED ORDER — POLYETHYLENE GLYCOL 3350 17 G PO PACK: 17.0000 g | PACK | Freq: Every day | ORAL | Status: DC | PRN

## 2022-08-20 MED ORDER — ONDANSETRON HCL 4 MG/2ML IJ SOLN: 4.0000 mg | Freq: Four times a day (QID) | INTRAMUSCULAR | Status: DC | PRN

## 2022-08-20 MED ORDER — ACETAMINOPHEN 650 MG RE SUPP: 650.0000 mg | Freq: Four times a day (QID) | RECTAL | Status: DC | PRN

## 2022-08-20 MED ORDER — SODIUM CHLORIDE 0.9 % IV BOLUS
1000.0000 mL | Freq: Once | INTRAVENOUS | Status: AC
  Administered 2022-08-20: 1000 mL via INTRAVENOUS

## 2022-08-20 MED ORDER — ACETAMINOPHEN 325 MG PO TABS
650.0000 mg | ORAL_TABLET | Freq: Four times a day (QID) | ORAL | Status: DC | PRN
  Administered 2022-08-21: 650 mg via ORAL
  Filled 2022-08-20: qty 2

## 2022-08-20 MED ORDER — ONDANSETRON HCL 4 MG PO TABS: 4.0000 mg | ORAL_TABLET | Freq: Four times a day (QID) | ORAL | Status: DC | PRN

## 2022-08-20 MED ORDER — KETOROLAC TROMETHAMINE 30 MG/ML IJ SOLN
30.0000 mg | Freq: Once | INTRAMUSCULAR | Status: AC
  Administered 2022-08-20: 30 mg via INTRAVENOUS
  Filled 2022-08-20: qty 1

## 2022-08-20 MED ORDER — SODIUM CHLORIDE 0.9 % IV SOLN: INTRAVENOUS | Status: DC

## 2022-08-20 NOTE — Plan of Care (Signed)
  Problem: Education: Goal: Knowledge of General Education information will improve Description: Including pain rating scale, medication(s)/side effects and non-pharmacologic comfort measures Outcome: Progressing   Problem: Pain Managment: Goal: General experience of comfort will improve Outcome: Progressing   

## 2022-08-20 NOTE — H&P (Signed)
TRH H&P   Patient Demographics:    Luke Dennis, is a 37 y.o. male  MRN: [id number]   DOB - 17-Jul-1985  Admit Date - 08/20/2022  Outpatient Primary MD for the patient is Patient, No Pcp Per  Referring MD/NP/PA: PA Celesta  Patient coming from: home  Chief Complaint  Patient presents with   Abdominal Pain      HPI:    Luke Dennis  is a 37 y.o. male, no significant past medical history, hip presents to ED secondary to complaints of severe abdominal pain, patient reports pain started at midnight, he reports this intermittent pain over the last year, but to much less extent, usually resolves with Tums or Pepto-Bismol, but yesterday was more severe, persistent did not improve with Tums or Pepto-Bismol, he does not drink alcohol, he smokes a pack of cigarettes a day, he takes [facility] powder for headaches couple times a week, denies nausea, vomiting, jaundice, diarrhea, constipation or hematochezia -in ED workup significant for leukocytosis at 15, hemoglobin elevated at 17.2, imaging significant for distended gallbladder wall with gallstone in the gallbladder neck, ED discussed with general surgery who recommended admission to hospitalist service with plan for surgery on Monday.    Review of systems:    A full 10 point Review of Systems was done, except as stated above, all other Review of Systems were negative.   With Past History of the following :    Past Medical History:  Diagnosis Date   Anxiety    Depression    Mental disorder    Substance abuse (HCC)       History reviewed. No pertinent surgical history.    Social History:     Social History   Tobacco Use   Smoking status: Every Day    Packs/day: 1.00    Years: 20.00    Additional pack years: 0.00    Total pack years: 20.00    Types: Cigarettes   Smokeless tobacco: Never  Substance Use Topics   Alcohol use:  Not Currently    Comment: none since 2014. previous social drinker     Family History :    History reviewed. No pertinent family history.    Home Medications:   Prior to Admission medications   Medication Sig Start Date End Date Taking? Authorizing Provider  Aspirin-Acetaminophen-Caffeine (GOODY HEADACHE PO) Take 1 packet by mouth daily as needed (for pain).   Yes [provider]  calcium carbonate (TUMS - DOSED IN MG ELEMENTAL CALCIUM) 500 MG chewable tablet Chew 1 tablet by mouth once as needed for indigestion or heartburn.   Yes [provider]  famotidine (PEPCID) 20 MG tablet Take 20 mg by mouth daily as needed for heartburn or indigestion.   Yes [provider]  simethicone (MYLICON) 80 MG chewable tablet Chew 80 mg by mouth every 6 (six) hours as needed for flatulence.  Yes [provider]     Allergies:     Allergies  Allergen Reactions   Haldol [Haloperidol Lactate] Other (See Comments)    lock jaw      Physical Exam:   Vitals  Blood pressure (!) 148/98, pulse 97, temperature 97.6 F (36.4 C), resp. rate 17, height 6' (1.829 m), weight 99.8 kg, SpO2 100 %.   1. General developed male, laying in bed, no apparent distress  2. Normal affect and insight, Not Suicidal or Homicidal, Awake Alert, Oriented X 3.  3. No F.N deficits, ALL C.Nerves Intact, Strength 5/5 all 4 extremities, Sensation intact all 4 extremities, Plantars down going.  4. Ears and Eyes appear Normal, Conjunctivae clear, PERRLA. Moist Oral Mucosa.  5. Supple Neck, No JVD, No cervical lymphadenopathy appriciated, No Carotid Bruits.  6. Symmetrical Chest wall movement, Good air movement bilaterally, CTAB.  7. RRR, No Gallops, Rubs or Murmurs, No Parasternal Heave.  8. Positive Bowel Sounds, Abdomen Soft, right upper quadrant tenderness, No organomegaly appriciated,No rebound -guarding or rigidity.  9.  No Cyanosis, Normal Skin Turgor, No Skin Rash or  Bruise.  10. Good muscle tone,  joints appear normal , no effusions, Normal ROM.    Data Review:    CBC Recent Labs  Lab 08/20/22 1412  WBC 15.2*  HGB 17.2*  HCT 49.6  PLT 210  MCV 90.5  MCH 31.4  MCHC 34.7  RDW 12.4   ------------------------------------------------------------------------------------------------------------------  Chemistries  Recent Labs  Lab 08/20/22 1412  NA 136  K 3.8  CL 100  CO2 25  GLUCOSE 109*  BUN 17  CREATININE 0.93  CALCIUM 10.2  AST 22  ALT 49*  ALKPHOS 85  BILITOT 1.6*   ------------------------------------------------------------------------------------------------------------------ estimated creatinine clearance is 133.1 mL/min (by C-G formula based on SCr of 0.93 mg/dL). ------------------------------------------------------------------------------------------------------------------ No results for input(s): "TSH", "T4TOTAL", "T3FREE", "THYROIDAB" in the last 72 hours.  Invalid input(s): "FREET3"  Coagulation profile No results for input(s): "INR", "PROTIME" in the last 168 hours. ------------------------------------------------------------------------------------------------------------------- No results for input(s): "DDIMER" in the last 72 hours. -------------------------------------------------------------------------------------------------------------------  Cardiac Enzymes No results for input(s): "CKMB", "TROPONINI", "MYOGLOBIN" in the last 168 hours.  Invalid input(s): "CK" ------------------------------------------------------------------------------------------------------------------ No results found for: "BNP"   ---------------------------------------------------------------------------------------------------------------  Urinalysis    Component Value Date/Time   COLORURINE YELLOW 07/09/2012 1415   APPEARANCEUR CLEAR 07/09/2012 1415   LABSPEC 1.031 (H) 07/09/2012 1415   PHURINE 6.0 07/09/2012 1415    GLUCOSEU NEGATIVE 07/09/2012 1415   HGBUR NEGATIVE 07/09/2012 1415   BILIRUBINUR NEGATIVE 07/09/2012 1415   KETONESUR NEGATIVE 07/09/2012 1415   PROTEINUR NEGATIVE 07/09/2012 1415   UROBILINOGEN 0.2 07/09/2012 1415   NITRITE NEGATIVE 07/09/2012 1415   LEUKOCYTESUR NEGATIVE 07/09/2012 1415    ----------------------------------------------------------------------------------------------------------------   Imaging Results:    CT ABDOMEN PELVIS W CONTRAST  Result Date: 08/20/2022 CLINICAL DATA:  Abdominal pain. EXAM: CT ABDOMEN AND PELVIS WITH CONTRAST TECHNIQUE: Multidetector CT imaging of the abdomen and pelvis was performed using the standard protocol following bolus administration of intravenous contrast. RADIATION DOSE REDUCTION: This exam was performed according to the departmental dose-optimization program which includes automated exposure control, adjustment of the mA and/or kV according to patient size and/or use of iterative reconstruction technique. CONTRAST:  OMNIPAQUE IOHEXOL 300 MG/ML  SOLN COMPARISON:  None Available. FINDINGS: Lower chest: Dependent atelectasis bilaterally. Hepatobiliary: Heterogeneous attenuation of liver parenchyma likely related to geographic fatty deposition. No suspicious focal abnormality within the liver parenchyma. No calcified gallstones although there appears to be  a 2.4 cm noncalcified stone in the neck of the gallbladder. Subtle gallbladder wall thickening is suspected (axial 38/2). Pancreas: No focal mass lesion. No dilatation of the main duct. No intraparenchymal cyst. No peripancreatic edema. Spleen: No splenomegaly. No focal mass lesion. Adrenals/Urinary Tract: No adrenal nodule or mass. Kidneys unremarkable. No evidence for hydroureter. The urinary bladder appears normal for the degree of distention. Stomach/Bowel: Stomach is unremarkable. No gastric wall thickening. No evidence of outlet obstruction. Duodenum is normally positioned as is the  ligament of Treitz. No small bowel wall thickening. No small bowel dilatation. The terminal ileum is normal. The appendix is normal. No gross colonic mass. No colonic wall thickening. Vascular/Lymphatic: There is mild atherosclerotic calcification of the abdominal aorta without aneurysm. There is no gastrohepatic or hepatoduodenal ligament lymphadenopathy. No retroperitoneal or mesenteric lymphadenopathy. No pelvic sidewall lymphadenopathy. Reproductive: The prostate gland and seminal vesicles are unremarkable. Other: No intraperitoneal free fluid. Musculoskeletal: No worrisome lytic or sclerotic osseous abnormality. IMPRESSION: 1. 2.4 cm noncalcified stone suspected in the neck of the gallbladder with subtle gallbladder wall thickening. Imaging features are suspicious for acute cholecystitis. Abdominal ultrasound recommended to further evaluate. 2. Heterogeneous attenuation of liver parenchyma likely related to geographic fatty deposition. 3.  Aortic Atherosclerosis (ICD10-I70.0). Electronically Signed   By: Kennith Center M.D.   On: 08/20/2022 15:28     EKG: Pending   Assessment & Plan:    Principal Problem:   Acute cholecystitis Active Problems:   Tobacco abuse  Acute cholecystitis /cholelithiasis -Presents with abdominal pain, imaging significant for cholelithiasis with gallstone in gallbladder neck, leukocytosis, tender to palpation, imaging with bladder wall thickening, will start on IV Rocephin, IV Flagyl, general surgery consulted by GI, continue with IV antibiotics, full liquid diet, and plan for surgical intervention likely by Monday, will keep on as needed pain meds, and stool softener meanwhile, will monitor see NP daily and white blood cell count daily as well  Tobacco abuse -will start on nicotine patch  Secondary polycythemia Dehydration -Globin count of 17.2, this is most likely in the setting of smoking and dehydration, will start on IV fluids  DVT Prophylaxis  Lovenox  AM Labs  Ordered, also please review Full Orders  Family Communication: Admission, patients condition and plan of care including tests being ordered have been discussed with the patient  who indicate understanding and agree with the plan and Code Status.  Code Status fULL  Likely DC to  HOME  Condition GUARDED    Consults called: GENERAL SURGERY    Admission status: INPATIENT    Time spent in minutes : 55 minutes   Huey Bienenstock M.D on 08/20/2022 at 4:06 PM   [facility] - Office  [phone]

## 2022-08-20 NOTE — ED Triage Notes (Signed)
Pt with sharp and stabbing abd pain since last night. Denies any N/V/D.

## 2022-08-20 NOTE — ED Notes (Signed)
Pt informed of need for urine specimen and given a urinal

## 2022-08-20 NOTE — ED Provider Notes (Signed)
[facility] Provider Note   CSN: [id number] Arrival date & time: 08/20/22  1326     History  Chief Complaint  Patient presents with   Abdominal Pain    Luke Dennis is a 37 y.o. male.  He denies any significant PMH.  States he does not typically go to doctors.  Presents the ER complaining of severe upper abdominal pain that started around midnight last night, did not improve with Tums or Pepto-Bismol.  He states had similar pain in the past to a lesser degree, and that always got a lot better with Tums.  He does not drink alcohol.  He does smoke a pack of cigarettes a day and states he takes [facility] powders for headaches a couple times a week.  No history of abdominal surgeries.   Abdominal Pain      Home Medications Prior to Admission medications   Medication Sig Start Date End Date Taking? Authorizing Provider  Aspirin-Acetaminophen-Caffeine (GOODY HEADACHE PO) Take 1 packet by mouth daily as needed (for pain).   Yes [provider]  calcium carbonate (TUMS - DOSED IN MG ELEMENTAL CALCIUM) 500 MG chewable tablet Chew 1 tablet by mouth once as needed for indigestion or heartburn.   Yes [provider]  famotidine (PEPCID) 20 MG tablet Take 20 mg by mouth daily as needed for heartburn or indigestion.   Yes [provider]  simethicone (MYLICON) 80 MG chewable tablet Chew 80 mg by mouth every 6 (six) hours as needed for flatulence.   Yes [provider]      Allergies    Haldol [haloperidol lactate]    Review of Systems   Review of Systems  Gastrointestinal:  Positive for abdominal pain.    Physical Exam Updated Vital Signs BP (!) 148/98 (BP Location: Left Arm)   Pulse 97   Temp 97.6 F (36.4 C)   Resp 17   Ht 6' (1.829 m)   Wt 99.8 kg   SpO2 100%   BMI 29.84 kg/m  Physical Exam Vitals and nursing note reviewed.  Constitutional:      General: He is not in acute distress.     Appearance: He is well-developed.  HENT:     Head: Normocephalic and atraumatic.  Eyes:     Conjunctiva/sclera: Conjunctivae normal.  Cardiovascular:     Rate and Rhythm: Normal rate and regular rhythm.     Heart sounds: No murmur heard. Pulmonary:     Effort: Pulmonary effort is normal. No respiratory distress.     Breath sounds: Normal breath sounds.  Abdominal:     Palpations: Abdomen is soft.     Tenderness: There is abdominal tenderness in the right upper quadrant and epigastric area. Positive signs include Murphy's sign. Negative signs include McBurney's sign.  Musculoskeletal:        General: No swelling.     Cervical back: Neck supple.  Skin:    General: Skin is warm and dry.     Capillary Refill: Capillary refill takes less than 2 seconds.  Neurological:     Mental Status: He is alert.  Psychiatric:        Mood and Affect: Mood normal.     ED Results / Procedures / Treatments   Labs (all labs ordered are listed, but only abnormal results are displayed) Labs Reviewed  COMPREHENSIVE METABOLIC PANEL - Abnormal; Notable for the following components:      Result Value   Glucose, Bld  109 (*)    ALT 49 (*)    Total Bilirubin 1.6 (*)    All other components within normal limits  CBC - Abnormal; Notable for the following components:   WBC 15.2 (*)    Hemoglobin 17.2 (*)    All other components within normal limits  LIPASE, BLOOD  URINALYSIS, ROUTINE W REFLEX MICROSCOPIC    EKG None  Radiology CT ABDOMEN PELVIS W CONTRAST  Result Date: 08/20/2022 CLINICAL DATA:  Abdominal pain. EXAM: CT ABDOMEN AND PELVIS WITH CONTRAST TECHNIQUE: Multidetector CT imaging of the abdomen and pelvis was performed using the standard protocol following bolus administration of intravenous contrast. RADIATION DOSE REDUCTION: This exam was performed according to the departmental dose-optimization program which includes automated exposure control, adjustment of the mA and/or kV according to  patient size and/or use of iterative reconstruction technique. CONTRAST:  OMNIPAQUE IOHEXOL 300 MG/ML  SOLN COMPARISON:  None Available. FINDINGS: Lower chest: Dependent atelectasis bilaterally. Hepatobiliary: Heterogeneous attenuation of liver parenchyma likely related to geographic fatty deposition. No suspicious focal abnormality within the liver parenchyma. No calcified gallstones although there appears to be a 2.4 cm noncalcified stone in the neck of the gallbladder. Subtle gallbladder wall thickening is suspected (axial 38/2). Pancreas: No focal mass lesion. No dilatation of the main duct. No intraparenchymal cyst. No peripancreatic edema. Spleen: No splenomegaly. No focal mass lesion. Adrenals/Urinary Tract: No adrenal nodule or mass. Kidneys unremarkable. No evidence for hydroureter. The urinary bladder appears normal for the degree of distention. Stomach/Bowel: Stomach is unremarkable. No gastric wall thickening. No evidence of outlet obstruction. Duodenum is normally positioned as is the ligament of Treitz. No small bowel wall thickening. No small bowel dilatation. The terminal ileum is normal. The appendix is normal. No gross colonic mass. No colonic wall thickening. Vascular/Lymphatic: There is mild atherosclerotic calcification of the abdominal aorta without aneurysm. There is no gastrohepatic or hepatoduodenal ligament lymphadenopathy. No retroperitoneal or mesenteric lymphadenopathy. No pelvic sidewall lymphadenopathy. Reproductive: The prostate gland and seminal vesicles are unremarkable. Other: No intraperitoneal free fluid. Musculoskeletal: No worrisome lytic or sclerotic osseous abnormality. IMPRESSION: 1. 2.4 cm noncalcified stone suspected in the neck of the gallbladder with subtle gallbladder wall thickening. Imaging features are suspicious for acute cholecystitis. Abdominal ultrasound recommended to further evaluate. 2. Heterogeneous attenuation of liver parenchyma likely related to  geographic fatty deposition. 3.  Aortic Atherosclerosis (ICD10-I70.0). Electronically Signed   By: Kennith Center M.D.   On: 08/20/2022 15:28    Procedures Procedures    Medications Ordered in ED Medications  cefTRIAXone (ROCEPHIN) 2 g in sodium chloride 0.9 % 100 mL IVPB (2 g Intravenous New Bag/Given 08/20/22 1538)  metroNIDAZOLE (FLAGYL) IVPB 500 mg (has no administration in time range)  cefTRIAXone (ROCEPHIN) 2 g in sodium chloride 0.9 % 100 mL IVPB (has no administration in time range)  morphine (PF) 4 MG/ML injection 4 mg (4 mg Intravenous Given 08/20/22 1426)  ondansetron (ZOFRAN) injection 4 mg (4 mg Intravenous Given 08/20/22 1426)  sodium chloride 0.9 % bolus 1,000 mL (1,000 mLs Intravenous Bolus 08/20/22 1455)  ketorolac (TORADOL) 30 MG/ML injection 30 mg (30 mg Intravenous Given 08/20/22 1456)  fentaNYL (SUBLIMAZE) injection 50 mcg (50 mcg Intravenous Given 08/20/22 1456)  iohexol (OMNIPAQUE) 300 MG/ML solution 100 mL (100 mLs Intravenous Contrast Given 08/20/22 1503)    ED Course/ Medical Decision Making/ A&P Clinical Course as of 08/20/22 1602  Sat Aug 20, 2022  1544 With Dr. Lovell Sheehan regarding patient's imaging findings as well as lab  abnormalities, he recommended antibiotics, admit to hospital medicine, he can have fluids he will see him in the morning and likely have surgery on Monday. [CB]    Clinical Course User Index [CB] Ma Rings, PA-C                             Medical Decision Making This patient presents to the ED for concern of RUQ pain, this involves an extensive number of treatment options, and is a complaint that carries with it a high risk of complications and morbidity.  The differential diagnosis includes gastritis, gastroenteritis, appendicitis, cholecystitis, diverticulitis, DKA, nephrolithiasis, gastroparesis, other    Co morbidities that complicate the patient evaluation :   none   Additional history obtained:  Additional history obtained from  EMR External records from outside source obtained and reviewed including prior notes for polysubstance abuse     Consultations Obtained:  I requested consultation with the general surgeon Dr. Lovell Sheehan,  and discussed lab and imaging findings as well as pertinent plan - they recommend: Admission by hospitalist, may have fluids, he will see patient in the morning not likely to take patient to the OR until Monday morning. I also consulted Dr. Randol Kern for admission and he was agreeable.    Problem List / ED Course / Critical interventions / Medication management  Here for severe right upper quadrant pain since midnight last night, had similar pain in the past but usually goes away with Tums, pain has been intractable despite Tums and Pepto-Bismol.  Positive Murphy sign on exam.  Patient has leukocytosis, elevated bili bilirubin and ALT, normal lipase.  CT shows a likely gallbladder neck stone and some gallbladder wall thickening concerning for cholecystitis.  Not able to get ultrasound at this time but patient's history and exam are consistent with cholecystitis as well.  He was given Rocephin.  Discussed with general surgery and hospitalist as above for admission I ordered medication including morphine, fentanyl and Toradol for pain Reevaluation of the patient after these medicines showed that the patient improved I have reviewed the patients home medicines and have made adjustments as needed      Amount and/or Complexity of Data Reviewed External Data Reviewed: labs and notes. Labs: ordered.    Details: Wbc 15.2, Lipase normal, ALT 49, Bilirubin 1.6   Radiology: ordered and independent interpretation performed. Decision-making details documented in ED Course.    Details: Stone in gallbladder neck with gallbladder wall thickening  Risk Prescription drug management.          Final Clinical Impression(s) / ED Diagnoses Final diagnoses:  Cholecystitis    Rx / DC Orders ED  Discharge Orders     None         Josem Kaufmann 08/20/22 1602    Eber Hong, MD 08/20/22 1705

## 2022-08-21 DIAGNOSIS — Z720 Tobacco use: Secondary | ICD-10-CM | POA: Diagnosis not present

## 2022-08-21 DIAGNOSIS — K810 Acute cholecystitis: Secondary | ICD-10-CM | POA: Diagnosis not present

## 2022-08-21 DIAGNOSIS — R030 Elevated blood-pressure reading, without diagnosis of hypertension: Secondary | ICD-10-CM | POA: Diagnosis not present

## 2022-08-21 LAB — CBC
HCT: 45.7 % (ref 39.0–52.0)
Hemoglobin: 15.5 g/dL (ref 13.0–17.0)
MCH: 31.4 pg (ref 26.0–34.0)
MCHC: 33.9 g/dL (ref 30.0–36.0)
MCV: 92.7 fL (ref 80.0–100.0)
Platelets: 180 10*3/uL (ref 150–400)
RBC: 4.93 MIL/uL (ref 4.22–5.81)
RDW: 12.6 % (ref 11.5–15.5)
WBC: 11.9 10*3/uL — ABNORMAL HIGH (ref 4.0–10.5)
nRBC: 0 % (ref 0.0–0.2)

## 2022-08-21 LAB — COMPREHENSIVE METABOLIC PANEL
ALT: 78 U/L — ABNORMAL HIGH (ref 0–44)
AST: 31 U/L (ref 15–41)
Albumin: 3.8 g/dL (ref 3.5–5.0)
Alkaline Phosphatase: 83 U/L (ref 38–126)
Anion gap: 7 (ref 5–15)
BUN: 15 mg/dL (ref 6–20)
CO2: 28 mmol/L (ref 22–32)
Calcium: 8.7 mg/dL — ABNORMAL LOW (ref 8.9–10.3)
Chloride: 101 mmol/L (ref 98–111)
Creatinine, Ser: 0.96 mg/dL (ref 0.61–1.24)
GFR, Estimated: >60 mL/min (ref >60)
Glucose, Bld: 91 mg/dL (ref 70–99)
Potassium: 3.9 mmol/L (ref 3.5–5.1)
Sodium: 136 mmol/L (ref 135–145)
Total Bilirubin: 2.3 mg/dL — ABNORMAL HIGH (ref 0.3–1.2)
Total Protein: 6.8 g/dL (ref 6.5–8.1)

## 2022-08-21 LAB — URINALYSIS, ROUTINE W REFLEX MICROSCOPIC
Bilirubin Urine: NEGATIVE
Glucose, UA: NEGATIVE mg/dL
Hgb urine dipstick: NEGATIVE
Ketones, ur: NEGATIVE mg/dL
Leukocytes,Ua: NEGATIVE
Nitrite: NEGATIVE
Protein, ur: NEGATIVE mg/dL
Specific Gravity, Urine: 1.019 (ref 1.005–1.030)
pH: 6 (ref 5.0–8.0)

## 2022-08-21 LAB — BILIRUBIN, DIRECT: Bilirubin, Direct: 0.3 mg/dL — ABNORMAL HIGH (ref 0.0–0.2)

## 2022-08-21 LAB — HIV ANTIBODY (ROUTINE TESTING W REFLEX): HIV Screen 4th Generation wRfx: NONREACTIVE

## 2022-08-21 LAB — SURGICAL PCR SCREEN
MRSA, PCR: NEGATIVE
Staphylococcus aureus: NEGATIVE

## 2022-08-21 MED ORDER — KETOROLAC TROMETHAMINE 30 MG/ML IJ SOLN
30.0000 mg | Freq: Once | INTRAMUSCULAR | Status: AC
  Administered 2022-08-21: 30 mg via INTRAVENOUS
  Filled 2022-08-21: qty 1

## 2022-08-21 MED ORDER — FENTANYL CITRATE PF 50 MCG/ML IJ SOSY
50.0000 ug | PREFILLED_SYRINGE | INTRAMUSCULAR | Status: DC | PRN
  Administered 2022-08-21 – 2022-08-23 (×10): 50 ug via INTRAVENOUS
  Filled 2022-08-21 (×10): qty 1

## 2022-08-21 MED ORDER — HYDRALAZINE HCL 20 MG/ML IJ SOLN: 10.0000 mg | Freq: Three times a day (TID) | INTRAMUSCULAR | Status: DC | PRN

## 2022-08-21 MED ORDER — CHLORHEXIDINE GLUCONATE CLOTH 2 % EX PADS
6.0000 | MEDICATED_PAD | Freq: Once | CUTANEOUS | Status: AC
  Administered 2022-08-21: 6 via TOPICAL

## 2022-08-21 MED ORDER — METOPROLOL TARTRATE 5 MG/5ML IV SOLN: 2.5000 mg | Freq: Four times a day (QID) | INTRAVENOUS | Status: DC | PRN

## 2022-08-21 MED ORDER — METOPROLOL TARTRATE 5 MG/5ML IV SOLN
2.5000 mg | Freq: Four times a day (QID) | INTRAVENOUS | Status: DC
  Filled 2022-08-21: qty 5

## 2022-08-21 MED ORDER — KETOROLAC TROMETHAMINE 30 MG/ML IJ SOLN
30.0000 mg | Freq: Four times a day (QID) | INTRAMUSCULAR | Status: AC | PRN
  Administered 2022-08-21 – 2022-08-22 (×4): 30 mg via INTRAVENOUS
  Filled 2022-08-21 (×4): qty 1

## 2022-08-21 MED ORDER — HYDROMORPHONE HCL 1 MG/ML IJ SOLN: 2.0000 mg | INTRAMUSCULAR | Status: DC | PRN

## 2022-08-21 MED ORDER — CHLORHEXIDINE GLUCONATE CLOTH 2 % EX PADS
6.0000 | MEDICATED_PAD | Freq: Once | CUTANEOUS | Status: AC
  Administered 2022-08-21 – 2022-08-22 (×2): 6 via TOPICAL

## 2022-08-21 NOTE — H&P (View-Only) (Signed)
Reason for Consult: Cholecystitis, cholelithiasis Referring Physician: Dr. Madera  Luke Dennis is an 37 y.o. male.  HPI: Patient is a 37-year-old white male who presented to the emergency room yesterday with worsening right upper quadrant abdominal pain and nausea.  A CT scan of the abdomen and pelvis was performed which revealed a distended gallbladder wall with a gallstone in the neck.  He was admitted to the hospital for further evaluation and treatment.  Patient states he has had mild episodes of this before, but this was the first time he required an emergency room visit.  He denies any fever, chills, or jaundice.  Past Medical History:  Diagnosis Date   Anxiety    Depression    Mental disorder    Substance abuse (HCC)     History reviewed. No pertinent surgical history.  History reviewed. No pertinent family history.  Social History:  reports that he has been smoking cigarettes. He has a 20.00 pack-year smoking history. He has never used smokeless tobacco. He reports that he does not currently use alcohol. He reports that he does not currently use drugs after having used the following drugs: Benzodiazepines, Fentanyl, Oxycodone, Marijuana, Hydrocodone, and Cocaine.  Allergies:  Allergies  Allergen Reactions   Haldol [Haloperidol Lactate] Other (See Comments)    lock jaw     Medications: I have reviewed the patient's current medications. Prior to Admission:  Medications Prior to Admission  Medication Sig Dispense Refill Last Dose   Aspirin-Acetaminophen-Caffeine (GOODY HEADACHE PO) Take 1 packet by mouth daily as needed (for pain).   Past Week   calcium carbonate (TUMS - DOSED IN MG ELEMENTAL CALCIUM) 500 MG chewable tablet Chew 1 tablet by mouth once as needed for indigestion or heartburn.   08/20/2022   famotidine (PEPCID) 20 MG tablet Take 20 mg by mouth daily as needed for heartburn or indigestion.   08/20/2022   simethicone (MYLICON) 80 MG chewable tablet Chew 80 mg by  mouth every 6 (six) hours as needed for flatulence.   08/20/2022    Results for orders placed or performed during the hospital encounter of 08/20/22 (from the past 48 hour(s))  Lipase, blood     Status: None   Collection Time: 08/20/22  2:12 PM  Result Value Ref Range   Lipase 27 11 - 51 U/L    Comment: Performed at [facility], [address] [place], [place] [address]  Comprehensive metabolic panel     Status: Abnormal   Collection Time: 08/20/22  2:12 PM  Result Value Ref Range   Sodium 136 135 - 145 mmol/L   Potassium 3.8 3.5 - 5.1 mmol/L   Chloride 100 98 - 111 mmol/L   CO2 25 22 - 32 mmol/L   Glucose, Bld 109 (H) 70 - 99 mg/dL    Comment: Glucose reference range applies only to samples taken after fasting for at least 8 hours.   BUN 17 6 - 20 mg/dL   Creatinine, Ser 0.93 0.61 - 1.24 mg/dL   Calcium 10.2 8.9 - 10.3 mg/dL   Total Protein 8.0 6.5 - 8.1 g/dL   Albumin 4.7 3.5 - 5.0 g/dL   AST 22 15 - 41 U/L   ALT 49 (H) 0 - 44 U/L   Alkaline Phosphatase 85 38 - 126 U/L   Total Bilirubin 1.6 (H) 0.3 - 1.2 mg/dL   GFR, Estimated >60 >60 mL/min    Comment: (NOTE) Calculated using the CKD-EPI Creatinine Equation (2021)    Anion gap   11 5 - 15    Comment: Performed at [facility], [address] [place], [place] [address]  CBC     Status: Abnormal   Collection Time: 08/20/22  2:12 PM  Result Value Ref Range   WBC 15.2 (H) 4.0 - 10.5 K/uL   RBC 5.48 4.22 - 5.81 MIL/uL   Hemoglobin 17.2 (H) 13.0 - 17.0 g/dL   HCT 49.6 39.0 - 52.0 %   MCV 90.5 80.0 - 100.0 fL   MCH 31.4 26.0 - 34.0 pg   MCHC 34.7 30.0 - 36.0 g/dL   RDW 12.4 11.5 - 15.5 %   Platelets 210 150 - 400 K/uL   nRBC 0.0 0.0 - 0.2 %    Comment: Performed at [facility], [address] [place], [place] [address]  Comprehensive metabolic panel     Status: Abnormal   Collection Time: 08/21/22  3:29 AM  Result Value Ref Range   Sodium 136 135 - 145 mmol/L   Potassium 3.9 3.5 - 5.1 mmol/L   Chloride 101 98 - 111  mmol/L   CO2 28 22 - 32 mmol/L   Glucose, Bld 91 70 - 99 mg/dL    Comment: Glucose reference range applies only to samples taken after fasting for at least 8 hours.   BUN 15 6 - 20 mg/dL   Creatinine, Ser 0.96 0.61 - 1.24 mg/dL   Calcium 8.7 (L) 8.9 - 10.3 mg/dL   Total Protein 6.8 6.5 - 8.1 g/dL   Albumin 3.8 3.5 - 5.0 g/dL   AST 31 15 - 41 U/L   ALT 78 (H) 0 - 44 U/L   Alkaline Phosphatase 83 38 - 126 U/L   Total Bilirubin 2.3 (H) 0.3 - 1.2 mg/dL   GFR, Estimated >60 >60 mL/min    Comment: (NOTE) Calculated using the CKD-EPI Creatinine Equation (2021)    Anion gap 7 5 - 15    Comment: Performed at [facility], [address] [place], [place] [address]  CBC     Status: Abnormal   Collection Time: 08/21/22  3:29 AM  Result Value Ref Range   WBC 11.9 (H) 4.0 - 10.5 K/uL   RBC 4.93 4.22 - 5.81 MIL/uL   Hemoglobin 15.5 13.0 - 17.0 g/dL   HCT 45.7 39.0 - 52.0 %   MCV 92.7 80.0 - 100.0 fL   MCH 31.4 26.0 - 34.0 pg   MCHC 33.9 30.0 - 36.0 g/dL   RDW 12.6 11.5 - 15.5 %   Platelets 180 150 - 400 K/uL   nRBC 0.0 0.0 - 0.2 %    Comment: Performed at  Hospital, [address] [place],  [address]    CT ABDOMEN PELVIS W CONTRAST  Result Date: 08/20/2022 CLINICAL DATA:  Abdominal pain. EXAM: CT ABDOMEN AND PELVIS WITH CONTRAST TECHNIQUE: Multidetector CT imaging of the abdomen and pelvis was performed using the standard protocol following bolus administration of intravenous contrast. RADIATION DOSE REDUCTION: This exam was performed according to the departmental dose-optimization program which includes automated exposure control, adjustment of the mA and/or kV according to patient size and/or use of iterative reconstruction technique. CONTRAST:  100mL OMNIPAQUE IOHEXOL 300 MG/ML  SOLN COMPARISON:  None Available. FINDINGS: Lower chest: Dependent atelectasis bilaterally. Hepatobiliary: Heterogeneous attenuation of liver parenchyma likely related to geographic fatty deposition. No  suspicious focal abnormality within the liver parenchyma. No calcified gallstones although there appears to be a 2.4 cm noncalcified stone in the neck of the gallbladder. Subtle gallbladder   wall thickening is suspected (axial 38/2). Pancreas: No focal mass lesion. No dilatation of the main duct. No intraparenchymal cyst. No peripancreatic edema. Spleen: No splenomegaly. No focal mass lesion. Adrenals/Urinary Tract: No adrenal nodule or mass. Kidneys unremarkable. No evidence for hydroureter. The urinary bladder appears normal for the degree of distention. Stomach/Bowel: Stomach is unremarkable. No gastric wall thickening. No evidence of outlet obstruction. Duodenum is normally positioned as is the ligament of Treitz. No small bowel wall thickening. No small bowel dilatation. The terminal ileum is normal. The appendix is normal. No gross colonic mass. No colonic wall thickening. Vascular/Lymphatic: There is mild atherosclerotic calcification of the abdominal aorta without aneurysm. There is no gastrohepatic or hepatoduodenal ligament lymphadenopathy. No retroperitoneal or mesenteric lymphadenopathy. No pelvic sidewall lymphadenopathy. Reproductive: The prostate gland and seminal vesicles are unremarkable. Other: No intraperitoneal free fluid. Musculoskeletal: No worrisome lytic or sclerotic osseous abnormality. IMPRESSION: 1. 2.4 cm noncalcified stone suspected in the neck of the gallbladder with subtle gallbladder wall thickening. Imaging features are suspicious for acute cholecystitis. Abdominal ultrasound recommended to further evaluate. 2. Heterogeneous attenuation of liver parenchyma likely related to geographic fatty deposition. 3.  Aortic Atherosclerosis (ICD10-I70.0). Electronically Signed   By: Eric  Mansell M.D.   On: 08/20/2022 15:28    ROS:  Pertinent items are noted in HPI.  Blood pressure (!) 147/95, pulse 75, temperature 98.6 F (37 C), temperature source Oral, resp. rate 18, height 6' (1.829 m),  weight 99.7 kg, SpO2 100 %. Physical Exam: Pleasant well-developed well-nourished white male no acute distress Head is normocephalic, atraumatic Eyes are without scleral icterus Lungs clear to auscultation with equal breath sounds bilaterally Heart examination reveals regular rate and rhythm without S3, S4, murmurs Abdomen is soft with tenderness noted in the right upper quadrant to palpation.  No hepatosplenomegaly, masses, or rigidity are noted.  CT scan images personally reviewed Total bilirubin elevated this morning to 2.6.  Assessment/Plan: Impression: Cholecystitis, cholelithiasis.  Mild transaminitis with elevated bilirubin.  Direct bilirubin pending. Plan: Will do MRCP tomorrow to assess hepatobiliary tree.  Will subsequently undergo a robotic assisted laparoscopic cholecystectomy.  The risks and benefits of the procedure including bleeding, infection, hepatobiliary injury, and the possibility of an open procedure were fully explained to the patient, who gave informed consent.  Will transfer patient to the surgery service.  Berry Godsey 08/21/2022, 9:33 AM      

## 2022-08-21 NOTE — Progress Notes (Signed)
Patient continues with pain during the night in the abdomen, PRN pain meds given. Continuing IV antibiotics.

## 2022-08-21 NOTE — Consult Note (Addendum)
Reason for Consult: Cholecystitis, cholelithiasis Referring Physician: Dr. Earney Luke Dennis is an 37 y.o. male.  HPI: Patient is a 37 year old white male who presented to the emergency room yesterday with worsening right upper quadrant abdominal pain and nausea.  A CT scan of the abdomen and pelvis was performed which revealed a distended gallbladder wall with a gallstone in the neck.  He was admitted to the hospital for further evaluation and treatment.  Patient states he has had mild episodes of this before, but this was the first time he required an emergency room visit.  He denies any fever, chills, or jaundice.  Past Medical History:  Diagnosis Date   Anxiety    Depression    Mental disorder    Substance abuse (HCC)     History reviewed. No pertinent surgical history.  History reviewed. No pertinent family history.  Social History:  reports that he has been smoking cigarettes. He has a 20.00 pack-year smoking history. He has never used smokeless tobacco. He reports that he does not currently use alcohol. He reports that he does not currently use drugs after having used the following drugs: Benzodiazepines, Fentanyl, Oxycodone, Marijuana, Hydrocodone, and Cocaine.  Allergies:  Allergies  Allergen Reactions   Haldol [Haloperidol Lactate] Other (See Comments)    lock jaw     Medications: I have reviewed the patient's current medications. Prior to Admission:  Medications Prior to Admission  Medication Sig Dispense Refill Last Dose   Aspirin-Acetaminophen-Caffeine (GOODY HEADACHE PO) Take 1 packet by mouth daily as needed (for pain).   Past Week   calcium carbonate (TUMS - DOSED IN MG ELEMENTAL CALCIUM) 500 MG chewable tablet Chew 1 tablet by mouth once as needed for indigestion or heartburn.   08/20/2022   famotidine (PEPCID) 20 MG tablet Take 20 mg by mouth daily as needed for heartburn or indigestion.   08/20/2022   simethicone (MYLICON) 80 MG chewable tablet Chew 80 mg by  mouth every 6 (six) hours as needed for flatulence.   08/20/2022    Results for orders placed or performed during the hospital encounter of 08/20/22 (from the past 48 hour(s))  Lipase, blood     Status: None   Collection Time: 08/20/22  2:12 PM  Result Value Ref Range   Lipase 27 11 - 51 U/L    Comment: Performed at [facility], [address], [place], [place] [address]  Comprehensive metabolic panel     Status: Abnormal   Collection Time: 08/20/22  2:12 PM  Result Value Ref Range   Sodium 136 135 - 145 mmol/L   Potassium 3.8 3.5 - 5.1 mmol/L   Chloride 100 98 - 111 mmol/L   CO2 25 22 - 32 mmol/L   Glucose, Bld 109 (H) 70 - 99 mg/dL    Comment: Glucose reference range applies only to samples taken after fasting for at least 8 hours.   BUN 17 6 - 20 mg/dL   Creatinine, Ser 1.91 0.61 - 1.24 mg/dL   Calcium 47.8 8.9 - 29.5 mg/dL   Total Protein 8.0 6.5 - 8.1 g/dL   Albumin 4.7 3.5 - 5.0 g/dL   AST 22 15 - 41 U/L   ALT 49 (H) 0 - 44 U/L   Alkaline Phosphatase 85 38 - 126 U/L   Total Bilirubin 1.6 (H) 0.3 - 1.2 mg/dL   GFR, Estimated >62 >13 mL/min    Comment: (NOTE) Calculated using the CKD-EPI Creatinine Equation (2021)    Anion gap  11 5 - 15    Comment: Performed at [facility], [address] [place], [place] [address]  CBC     Status: Abnormal   Collection Time: 08/20/22  2:12 PM  Result Value Ref Range   WBC 15.2 (H) 4.0 - 10.5 K/uL   RBC 5.48 4.22 - 5.81 MIL/uL   Hemoglobin 17.2 (H) 13.0 - 17.0 g/dL   HCT 60.4 54.0 - 98.1 %   MCV 90.5 80.0 - 100.0 fL   MCH 31.4 26.0 - 34.0 pg   MCHC 34.7 30.0 - 36.0 g/dL   RDW 19.1 47.8 - 29.5 %   Platelets 210 150 - 400 K/uL   nRBC 0.0 0.0 - 0.2 %    Comment: Performed at [facility], [address] [place], [place] [address]  Comprehensive metabolic panel     Status: Abnormal   Collection Time: 08/21/22  3:29 AM  Result Value Ref Range   Sodium 136 135 - 145 mmol/L   Potassium 3.9 3.5 - 5.1 mmol/L   Chloride 101 98 - 111  mmol/L   CO2 28 22 - 32 mmol/L   Glucose, Bld 91 70 - 99 mg/dL    Comment: Glucose reference range applies only to samples taken after fasting for at least 8 hours.   BUN 15 6 - 20 mg/dL   Creatinine, Ser 8.65 0.61 - 1.24 mg/dL   Calcium 8.7 (L) 8.9 - 10.3 mg/dL   Total Protein 6.8 6.5 - 8.1 g/dL   Albumin 3.8 3.5 - 5.0 g/dL   AST 31 15 - 41 U/L   ALT 78 (H) 0 - 44 U/L   Alkaline Phosphatase 83 38 - 126 U/L   Total Bilirubin 2.3 (H) 0.3 - 1.2 mg/dL   GFR, Estimated >78 >46 mL/min    Comment: (NOTE) Calculated using the CKD-EPI Creatinine Equation (2021)    Anion gap 7 5 - 15    Comment: Performed at [facility], [address] [place], [place] [address]  CBC     Status: Abnormal   Collection Time: 08/21/22  3:29 AM  Result Value Ref Range   WBC 11.9 (H) 4.0 - 10.5 K/uL   RBC 4.93 4.22 - 5.81 MIL/uL   Hemoglobin 15.5 13.0 - 17.0 g/dL   HCT 28.4 13.2 - 44.0 %   MCV 92.7 80.0 - 100.0 fL   MCH 31.4 26.0 - 34.0 pg   MCHC 33.9 30.0 - 36.0 g/dL   RDW 10.2 72.5 - 36.6 %   Platelets 180 150 - 400 K/uL   nRBC 0.0 0.0 - 0.2 %    Comment: Performed at [facility], [address], [place], [place] [address]    CT ABDOMEN PELVIS W CONTRAST  Result Date: 08/20/2022 CLINICAL DATA:  Abdominal pain. EXAM: CT ABDOMEN AND PELVIS WITH CONTRAST TECHNIQUE: Multidetector CT imaging of the abdomen and pelvis was performed using the standard protocol following bolus administration of intravenous contrast. RADIATION DOSE REDUCTION: This exam was performed according to the departmental dose-optimization program which includes automated exposure control, adjustment of the mA and/or kV according to patient size and/or use of iterative reconstruction technique. CONTRAST:  OMNIPAQUE IOHEXOL 300 MG/ML  SOLN COMPARISON:  None Available. FINDINGS: Lower chest: Dependent atelectasis bilaterally. Hepatobiliary: Heterogeneous attenuation of liver parenchyma likely related to geographic fatty deposition. No  suspicious focal abnormality within the liver parenchyma. No calcified gallstones although there appears to be a 2.4 cm noncalcified stone in the neck of the gallbladder. Subtle gallbladder  wall thickening is suspected (axial 38/2). Pancreas: No focal mass lesion. No dilatation of the main duct. No intraparenchymal cyst. No peripancreatic edema. Spleen: No splenomegaly. No focal mass lesion. Adrenals/Urinary Tract: No adrenal nodule or mass. Kidneys unremarkable. No evidence for hydroureter. The urinary bladder appears normal for the degree of distention. Stomach/Bowel: Stomach is unremarkable. No gastric wall thickening. No evidence of outlet obstruction. Duodenum is normally positioned as is the ligament of Treitz. No small bowel wall thickening. No small bowel dilatation. The terminal ileum is normal. The appendix is normal. No gross colonic mass. No colonic wall thickening. Vascular/Lymphatic: There is mild atherosclerotic calcification of the abdominal aorta without aneurysm. There is no gastrohepatic or hepatoduodenal ligament lymphadenopathy. No retroperitoneal or mesenteric lymphadenopathy. No pelvic sidewall lymphadenopathy. Reproductive: The prostate gland and seminal vesicles are unremarkable. Other: No intraperitoneal free fluid. Musculoskeletal: No worrisome lytic or sclerotic osseous abnormality. IMPRESSION: 1. 2.4 cm noncalcified stone suspected in the neck of the gallbladder with subtle gallbladder wall thickening. Imaging features are suspicious for acute cholecystitis. Abdominal ultrasound recommended to further evaluate. 2. Heterogeneous attenuation of liver parenchyma likely related to geographic fatty deposition. 3.  Aortic Atherosclerosis (ICD10-I70.0). Electronically Signed   By: Kennith Center M.D.   On: 08/20/2022 15:28    ROS:  Pertinent items are noted in HPI.  Blood pressure (!) 147/95, pulse 75, temperature 98.6 F (37 C), temperature source Oral, resp. rate 18, height 6' (1.829 m),  weight 99.7 kg, SpO2 100 %. Physical Exam: Pleasant well-developed well-nourished white male no acute distress Head is normocephalic, atraumatic Eyes are without scleral icterus Lungs clear to auscultation with equal breath sounds bilaterally Heart examination reveals regular rate and rhythm without S3, S4, murmurs Abdomen is soft with tenderness noted in the right upper quadrant to palpation.  No hepatosplenomegaly, masses, or rigidity are noted.  CT scan images personally reviewed Total bilirubin elevated this morning to 2.6.  Assessment/Plan: Impression: Cholecystitis, cholelithiasis.  Mild transaminitis with elevated bilirubin.  Direct bilirubin pending. Plan: Will do MRCP tomorrow to assess hepatobiliary tree.  Will subsequently undergo a robotic assisted laparoscopic cholecystectomy.  The risks and benefits of the procedure including bleeding, infection, hepatobiliary injury, and the possibility of an open procedure were fully explained to the patient, who gave informed consent.  Will transfer patient to the surgery service.  Luke Dennis 08/21/2022, 9:33 AM

## 2022-08-21 NOTE — Progress Notes (Signed)
  Progress Note   Patient: Luke Dennis:[id number] DOB: Jun 06, 1985 DOA: 08/20/2022     1 DOS: the patient was seen and examined on 08/21/2022   Brief hospital admission narrative: [age] year old male without past medical history outside from tobacco abuse and prior history of drug abuse; who presented to the hospital secondary to abdominal pain.  Demonstrating with gallstone in the neck suggestive for acute cholecystitis.  Patient with elevated WBCs transaminitis/bilirubin elevation.  General surgery primary service.  Assessment and Plan: 1-acute cholecystitis -Continue fluid resuscitation, analgesics, antiemetics biotics. -MRCP planned for 1624 and possible cholecystectomy -Continue to follow primary service recommendations.  2-tobacco abuse -Cessation counseling provided -Continue nicotine patch.  3-elevated blood pressure -No prior history of hypertension -Going pain is likely responsible for elevated BP -Blood pressure and as needed hydralazine has been started -Continue analgesics -Follow-up vital signs.  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  5-overweight -Body mass index is 29.8 kg/m. -Low-calorie diet, portion control and increase physical activity discussed with patient.  Subjective:  Complaining of right upper quadrant abdominal pain and nausea; blood pressure slightly elevated most likely in the setting of acute pain.  Low-grade temperature appreciated on vital signs.  Currently afebrile.  No vomiting or any other complaints.  Physical Exam: Vitals:   08/20/22 1610 08/20/22 1627 08/20/22 2000 08/21/22 0401  BP: (!) 154/99 (!) 141/93 (!) 138/93 (!) 147/95  Pulse: 95 86 70 75  Resp: 18 20 20 18   Temp: 97.8 F (36.6 C) 98.3 F (36.8 C) 98.6 F (37 C) 98.6 F (37 C)  TempSrc: Oral Oral Oral Oral  SpO2: 100% 99% 98% 100%  Weight:  99.7 kg     Height:  6' (1.829 m)     General exam: Alert, awake, oriented x 3; complaining of right upper quadrant pain.  Low-grade temperature appreciated.  Reports no vomiting. Respiratory system: Clear to auscultation. Respiratory effort normal.  Good saturation on room air. Cardiovascular system:RRR. No rubs or gallops; no JVD. Gastrointestinal system: Abdomen is slightly obese, tender to palpation in right upper quadrant area; positive bowel sounds, no guarding.   Central nervous system: Alert and oriented. No focal neurological deficits. Extremities: No cyanosis or clubbing. Skin: No petechiae. Psychiatry: Judgement and insight appear normal. Mood & affect appropriate.    Data Reviewed: Comprehensive metabolic panel: Sodium 136, potassium 3.9, chloride 101, bicarb 28, glucose 91, BUN 15, creatinine 0.96, AST 31, ALT 78, alkaline phosphatase 83, total bilirubin 2.3 (direct bilirubin 0.3) CBC: WBCs 11.9, hemoglobin 15.5 and platelet count 180 K   Family Communication: No family at bedside.  Disposition: Status is: Inpatient Remains inpatient appropriate because: continue IV antibiotics and follow further recommendations/surgical intervention for ongoing acute cholecystitis process.   Planned Discharge Destination: Home   Time spent: 35 minutes  Author: Vassie Loll, MD 08/21/2022 10:47 AM  For on call review [email or web address].

## 2022-08-21 NOTE — TOC Initial Note (Signed)
Transition of Care [facility]) - Initial/Assessment Note    Patient Details  Name: Luke Dennis MRN: [id number] Date of Birth: 1986/02/13  Transition of Care (TOC) CM/SW Contact:    Catalina Gravel, LCSW Phone Number: 08/21/2022, 10:34 AM  Clinical Narrative:                 Pt reported to [facility] ER due to sharp pain, surgery scheduled Mon due to medical condition. TOC will continue to follow.    Barriers to Discharge: Continued Medical Work up   Patient Goals and CMS Choice            Expected Discharge Plan and Services                                              Prior Living Arrangements/Services                       Activities of Daily Living Home Assistive Devices/Equipment: None ADL Screening (condition at time of admission) Patient's cognitive ability adequate to safely complete daily activities?: Yes Is the patient deaf or have difficulty hearing?: No Does the patient have difficulty seeing, even when wearing glasses/contacts?: No Does the patient have difficulty concentrating, remembering, or making decisions?: No Patient able to express need for assistance with ADLs?: Yes Does the patient have difficulty dressing or bathing?: No Independently performs ADLs?: Yes (appropriate for developmental age) Does the patient have difficulty walking or climbing stairs?: No Weakness of Legs: None Weakness of Arms/Hands: None  Permission Sought/Granted                  Emotional Assessment              Admission diagnosis:  Acute cholecystitis [K81.0] Cholecystitis [K81.9] Patient Active Problem List   Diagnosis Date Noted   Acute cholecystitis 08/20/2022   Tobacco abuse 08/20/2022   Polysubstance dependence (HCC) 07/05/2012   Substance induced mood disorder (HCC) 07/05/2012   PCP:  Patient, No Pcp Per Pharmacy:   [id number] - [place], [place] - [address] [place] [address] Phone: [phone] Fax:  [phone]  [id number] - [place], [place] - [address] AT [facility] [address]  [place] [address] Phone: [phone] Fax: [phone]     Social Determinants of Health (SDOH) Social History: SDOH Screenings   Food Insecurity: No Food Insecurity (08/20/2022)  Housing: Low Risk  (08/20/2022)  Transportation Needs: No Transportation Needs (08/20/2022)  Utilities: Not At Risk (08/20/2022)  Tobacco Use: High Risk (08/20/2022)   SDOH Interventions:     Readmission Risk Interventions     No data to display

## 2022-08-22 ENCOUNTER — Encounter (HOSPITAL_COMMUNITY)
Admission: EM | Disposition: A | Discharge status: Home / Self Care | Payer: Self-pay | Source: Home / Self Care | Attending: General Surgery

## 2022-08-22 ENCOUNTER — Encounter (HOSPITAL_COMMUNITY): Payer: Self-pay | Admitting: Internal Medicine

## 2022-08-22 ENCOUNTER — Inpatient Hospital Stay (HOSPITAL_COMMUNITY): Payer: PRIVATE HEALTH INSURANCE | Admitting: Anesthesiology

## 2022-08-22 ENCOUNTER — Inpatient Hospital Stay (HOSPITAL_COMMUNITY): Payer: PRIVATE HEALTH INSURANCE

## 2022-08-22 ENCOUNTER — Other Ambulatory Visit: Payer: Self-pay

## 2022-08-22 DIAGNOSIS — F418 Other specified anxiety disorders: Secondary | ICD-10-CM

## 2022-08-22 DIAGNOSIS — K8000 Calculus of gallbladder with acute cholecystitis without obstruction: Secondary | ICD-10-CM

## 2022-08-22 DIAGNOSIS — Z720 Tobacco use: Secondary | ICD-10-CM | POA: Diagnosis not present

## 2022-08-22 DIAGNOSIS — K810 Acute cholecystitis: Secondary | ICD-10-CM | POA: Diagnosis not present

## 2022-08-22 DIAGNOSIS — R030 Elevated blood-pressure reading, without diagnosis of hypertension: Secondary | ICD-10-CM | POA: Diagnosis not present

## 2022-08-22 DIAGNOSIS — F17210 Nicotine dependence, cigarettes, uncomplicated: Secondary | ICD-10-CM

## 2022-08-22 LAB — COMPREHENSIVE METABOLIC PANEL
ALT: 82 U/L — ABNORMAL HIGH (ref 0–44)
AST: 23 U/L (ref 15–41)
Albumin: 3.5 g/dL (ref 3.5–5.0)
Alkaline Phosphatase: 88 U/L (ref 38–126)
Anion gap: 8 (ref 5–15)
BUN: 13 mg/dL (ref 6–20)
CO2: 26 mmol/L (ref 22–32)
Calcium: 8.4 mg/dL — ABNORMAL LOW (ref 8.9–10.3)
Chloride: 102 mmol/L (ref 98–111)
Creatinine, Ser: 0.85 mg/dL (ref 0.61–1.24)
GFR, Estimated: >60 mL/min (ref >60)
Glucose, Bld: 90 mg/dL (ref 70–99)
Potassium: 3.6 mmol/L (ref 3.5–5.1)
Sodium: 136 mmol/L (ref 135–145)
Total Bilirubin: 2.3 mg/dL — ABNORMAL HIGH (ref 0.3–1.2)
Total Protein: 6.2 g/dL — ABNORMAL LOW (ref 6.5–8.1)

## 2022-08-22 LAB — CBC
HCT: 42.1 % (ref 39.0–52.0)
Hemoglobin: 14.1 g/dL (ref 13.0–17.0)
MCH: 31.2 pg (ref 26.0–34.0)
MCHC: 33.5 g/dL (ref 30.0–36.0)
MCV: 93.1 fL (ref 80.0–100.0)
Platelets: 154 10*3/uL (ref 150–400)
RBC: 4.52 MIL/uL (ref 4.22–5.81)
RDW: 12.4 % (ref 11.5–15.5)
WBC: 9.7 10*3/uL (ref 4.0–10.5)
nRBC: 0 % (ref 0.0–0.2)

## 2022-08-22 SURGERY — CHOLECYSTECTOMY, ROBOT-ASSISTED, LAPAROSCOPIC
Anesthesia: General | Site: Abdomen

## 2022-08-22 MED ORDER — ALUM & MAG HYDROXIDE-SIMETH 200-200-20 MG/5ML PO SUSP: 30.0000 mL | Freq: Four times a day (QID) | ORAL | Status: DC | PRN

## 2022-08-22 MED ORDER — INDOCYANINE GREEN 25 MG IV SOLR: 2.5000 mg | Freq: Once | INTRAVENOUS | Status: AC

## 2022-08-22 MED ORDER — DEXAMETHASONE SODIUM PHOSPHATE 10 MG/ML IJ SOLN
INTRAMUSCULAR | Status: DC | PRN
  Administered 2022-08-22: 10 mg via INTRAVENOUS

## 2022-08-22 MED ORDER — CEFAZOLIN SODIUM-DEXTROSE 2-4 GM/100ML-% IV SOLN
2.0000 g | Freq: Once | INTRAVENOUS | Status: AC
  Administered 2022-08-22: 2 g via INTRAVENOUS

## 2022-08-22 MED ORDER — SEVOFLURANE IN SOLN
RESPIRATORY_TRACT | Status: AC
  Filled 2022-08-22: qty 250

## 2022-08-22 MED ORDER — KETOROLAC TROMETHAMINE 30 MG/ML IJ SOLN
INTRAMUSCULAR | Status: AC
  Filled 2022-08-22: qty 1

## 2022-08-22 MED ORDER — METOCLOPRAMIDE HCL 5 MG/ML IJ SOLN
10.0000 mg | Freq: Once | INTRAMUSCULAR | Status: AC
  Administered 2022-08-22: 10 mg via INTRAVENOUS

## 2022-08-22 MED ORDER — ONDANSETRON HCL 4 MG/2ML IJ SOLN
INTRAMUSCULAR | Status: AC
  Filled 2022-08-22: qty 2

## 2022-08-22 MED ORDER — ROCURONIUM BROMIDE 10 MG/ML (PF) SYRINGE
PREFILLED_SYRINGE | INTRAVENOUS | Status: AC
  Filled 2022-08-22: qty 10

## 2022-08-22 MED ORDER — PHENYLEPHRINE 80 MCG/ML (10ML) SYRINGE FOR IV PUSH (FOR BLOOD PRESSURE SUPPORT)
PREFILLED_SYRINGE | INTRAVENOUS | Status: DC | PRN
  Administered 2022-08-22: 80 ug via INTRAVENOUS

## 2022-08-22 MED ORDER — MIDAZOLAM HCL 2 MG/2ML IJ SOLN
INTRAMUSCULAR | Status: AC
  Filled 2022-08-22: qty 2

## 2022-08-22 MED ORDER — HYDROMORPHONE HCL 1 MG/ML IJ SOLN
0.2500 mg | INTRAMUSCULAR | Status: DC | PRN
  Administered 2022-08-22 (×3): 0.5 mg via INTRAVENOUS
  Filled 2022-08-22: qty 0.5

## 2022-08-22 MED ORDER — SUGAMMADEX SODIUM 200 MG/2ML IV SOLN
INTRAVENOUS | Status: DC | PRN
  Administered 2022-08-22: 200 mg via INTRAVENOUS

## 2022-08-22 MED ORDER — BUPIVACAINE HCL (PF) 0.5 % IJ SOLN
INTRAMUSCULAR | Status: DC | PRN
  Administered 2022-08-22: 30 mL

## 2022-08-22 MED ORDER — METOCLOPRAMIDE HCL 5 MG/ML IJ SOLN
INTRAMUSCULAR | Status: AC
  Filled 2022-08-22: qty 2

## 2022-08-22 MED ORDER — PROPOFOL 10 MG/ML IV BOLUS
INTRAVENOUS | Status: AC
  Filled 2022-08-22: qty 20

## 2022-08-22 MED ORDER — LIDOCAINE HCL (PF) 2 % IJ SOLN
INTRAMUSCULAR | Status: AC
  Filled 2022-08-22: qty 5

## 2022-08-22 MED ORDER — SODIUM CHLORIDE 0.9 % IR SOLN
Status: DC | PRN
  Administered 2022-08-22: 3000 mL

## 2022-08-22 MED ORDER — ORAL CARE MOUTH RINSE: 15.0000 mL | Freq: Once | OROMUCOSAL | Status: AC

## 2022-08-22 MED ORDER — SIMETHICONE 80 MG PO CHEW
80.0000 mg | CHEWABLE_TABLET | Freq: Four times a day (QID) | ORAL | Status: DC | PRN
  Administered 2022-08-23 (×2): 80 mg via ORAL
  Filled 2022-08-22 (×2): qty 1

## 2022-08-22 MED ORDER — HYDROCODONE-ACETAMINOPHEN 5-325 MG PO TABS: 1.0000 | ORAL_TABLET | ORAL | 0 refills | Status: DC | PRN

## 2022-08-22 MED ORDER — PROPOFOL 10 MG/ML IV BOLUS
INTRAVENOUS | Status: DC | PRN
  Administered 2022-08-22: 200 mg via INTRAVENOUS

## 2022-08-22 MED ORDER — CHLORHEXIDINE GLUCONATE 0.12 % MT SOLN
15.0000 mL | Freq: Once | OROMUCOSAL | Status: AC
  Administered 2022-08-22: 15 mL via OROMUCOSAL

## 2022-08-22 MED ORDER — STERILE WATER FOR IRRIGATION IR SOLN
Status: DC | PRN
  Administered 2022-08-22: 500 mL

## 2022-08-22 MED ORDER — INDOCYANINE GREEN 25 MG IV SOLR
INTRAVENOUS | Status: AC
  Administered 2022-08-22: 2.5 mg via INTRAVENOUS
  Filled 2022-08-22: qty 10

## 2022-08-22 MED ORDER — GADOBUTROL 1 MMOL/ML IV SOLN
10.0000 mL | Freq: Once | INTRAVENOUS | Status: AC | PRN
  Administered 2022-08-22: 10 mL via INTRAVENOUS

## 2022-08-22 MED ORDER — FENTANYL CITRATE (PF) 250 MCG/5ML IJ SOLN
INTRAMUSCULAR | Status: AC
  Filled 2022-08-22: qty 5

## 2022-08-22 MED ORDER — KETOROLAC TROMETHAMINE 30 MG/ML IJ SOLN
INTRAMUSCULAR | Status: DC | PRN
  Administered 2022-08-22: 30 mg via INTRAVENOUS

## 2022-08-22 MED ORDER — CEFAZOLIN SODIUM-DEXTROSE 2-4 GM/100ML-% IV SOLN
INTRAVENOUS | Status: AC
  Filled 2022-08-22: qty 100

## 2022-08-22 MED ORDER — HEMOSTATIC AGENTS (NO CHARGE) OPTIME
TOPICAL | Status: DC | PRN
  Administered 2022-08-22: 1

## 2022-08-22 MED ORDER — HYDROMORPHONE HCL 1 MG/ML IJ SOLN
INTRAMUSCULAR | Status: AC
  Filled 2022-08-22: qty 0.5

## 2022-08-22 MED ORDER — MEPERIDINE HCL 50 MG/ML IJ SOLN: 6.2500 mg | INTRAMUSCULAR | Status: DC | PRN

## 2022-08-22 MED ORDER — LIDOCAINE HCL (CARDIAC) PF 100 MG/5ML IV SOSY
PREFILLED_SYRINGE | INTRAVENOUS | Status: DC | PRN
  Administered 2022-08-22: 60 mg via INTRATRACHEAL

## 2022-08-22 MED ORDER — LACTATED RINGERS IV SOLN: INTRAVENOUS | Status: DC

## 2022-08-22 MED ORDER — ROCURONIUM BROMIDE 10 MG/ML (PF) SYRINGE
PREFILLED_SYRINGE | INTRAVENOUS | Status: DC | PRN
  Administered 2022-08-22: 60 mg via INTRAVENOUS
  Administered 2022-08-22: 20 mg via INTRAVENOUS

## 2022-08-22 MED ORDER — DEXAMETHASONE SODIUM PHOSPHATE 10 MG/ML IJ SOLN
INTRAMUSCULAR | Status: AC
  Filled 2022-08-22: qty 1

## 2022-08-22 MED ORDER — BUPIVACAINE HCL (PF) 0.5 % IJ SOLN
INTRAMUSCULAR | Status: AC
  Filled 2022-08-22: qty 30

## 2022-08-22 MED ORDER — ONDANSETRON HCL 4 MG/2ML IJ SOLN
INTRAMUSCULAR | Status: DC | PRN
  Administered 2022-08-22: 4 mg via INTRAVENOUS

## 2022-08-22 MED ORDER — FENTANYL CITRATE (PF) 100 MCG/2ML IJ SOLN
INTRAMUSCULAR | Status: DC | PRN
  Administered 2022-08-22 (×2): 100 ug via INTRAVENOUS
  Administered 2022-08-22: 50 ug via INTRAVENOUS

## 2022-08-22 MED ORDER — MIDAZOLAM HCL 2 MG/2ML IJ SOLN
INTRAMUSCULAR | Status: DC | PRN
  Administered 2022-08-22: 2 mg via INTRAVENOUS

## 2022-08-22 MED ORDER — INDOCYANINE GREEN 25 MG IV SOLR: 2.5000 mg | Freq: Once | INTRAVENOUS | Status: DC

## 2022-08-22 SURGICAL SUPPLY — 40 items
CANNULA REDUCER 12-8 DVNC XI (CANNULA) IMPLANT
CAUTERY HOOK MNPLR 1.6 DVNC XI (INSTRUMENTS) ×1 IMPLANT
CHLORAPREP W/TINT 26 (MISCELLANEOUS) ×1 IMPLANT
CLIP LIGATING HEM O LOK PURPLE (MISCELLANEOUS) ×1 IMPLANT
COVER TIP SHEARS 8 DVNC (MISCELLANEOUS) IMPLANT
DERMABOND ADVANCED .7 DNX12 (GAUZE/BANDAGES/DRESSINGS) ×1 IMPLANT
DRAPE ARM DVNC X/XI (DISPOSABLE) ×4 IMPLANT
DRAPE COLUMN DVNC XI (DISPOSABLE) ×1 IMPLANT
ELECT REM PT RETURN 9FT ADLT (ELECTROSURGICAL) ×1
ELECTRODE REM PT RTRN 9FT ADLT (ELECTROSURGICAL) ×1 IMPLANT
FORCEPS BPLR R/ABLATION 8 DVNC (INSTRUMENTS) ×1 IMPLANT
FORCEPS PROGRASP DVNC XI (FORCEP) ×1 IMPLANT
GLOVE BIOGEL PI IND STRL 7.0 (GLOVE) ×2 IMPLANT
GLOVE ECLIPSE 6.5 STRL STRAW (GLOVE) IMPLANT
GLOVE SURG SS PI 7.5 STRL IVOR (GLOVE) ×2 IMPLANT
GOWN STRL REUS W/TWL LRG LVL3 (GOWN DISPOSABLE) ×3 IMPLANT
IRRIGATOR SUCT 8 DISP DVNC XI (IRRIGATION / IRRIGATOR) IMPLANT
IV NS IRRIG 3000ML ARTHROMATIC (IV SOLUTION) IMPLANT
KIT TURNOVER KIT A (KITS) ×1 IMPLANT
MANIFOLD NEPTUNE II (INSTRUMENTS) ×1 IMPLANT
NDL HYPO 21X1.5 SAFETY (NEEDLE) ×1 IMPLANT
NDL INSUFFLATION 14GA 120MM (NEEDLE) ×1 IMPLANT
NEEDLE HYPO 21X1.5 SAFETY (NEEDLE) ×1 IMPLANT
NEEDLE INSUFFLATION 14GA 120MM (NEEDLE) ×1 IMPLANT
OBTURATOR OPTICAL STND 8 DVNC (TROCAR) ×1
OBTURATOR OPTICALSTD 8 DVNC (TROCAR) ×1 IMPLANT
PACK LAP CHOLE LZT030E (CUSTOM PROCEDURE TRAY) ×1 IMPLANT
PAD ARMBOARD 7.5X6 YLW CONV (MISCELLANEOUS) ×1 IMPLANT
PENCIL HANDSWITCHING (ELECTRODE) ×1 IMPLANT
POWDER SURGICEL 3.0 GRAM (HEMOSTASIS) IMPLANT
SCISSORS MNPLR CVD DVNC XI (INSTRUMENTS) IMPLANT
SEAL CANN UNIV 5-8 DVNC XI (MISCELLANEOUS) ×4 IMPLANT
SET TUBE SMOKE EVAC HIGH FLOW (TUBING) ×1 IMPLANT
SUT MNCRL AB 4-0 PS2 18 (SUTURE) ×2 IMPLANT
SUT VICRYL 0 AB UR-6 (SUTURE) ×1 IMPLANT
SYR 30ML LL (SYRINGE) ×1 IMPLANT
SYS RETRIEVAL 5MM INZII UNIV (BASKET) ×1
SYSTEM RETRIEVL 5MM INZII UNIV (BASKET) ×1 IMPLANT
TIP ENDOSCOPIC SURGICEL (TIP) IMPLANT
WATER STERILE IRR 500ML POUR (IV SOLUTION) ×1 IMPLANT

## 2022-08-22 NOTE — Op Note (Signed)
Patient:  Luke Dennis  DOB:  December 26, 1985  MRN:  [id number]   Preop Diagnosis: Acute cholecystitis, cholelithiasis  Postop Diagnosis: Same  Procedure: Robotic assisted laparoscopic cholecystectomy  Surgeon: Franky Macho, MD  Anes: General endotracheal  Indications: Patient is a 37 year old white male who presents with acute cholecystitis secondary to cholelithiasis.  The risks and benefits of the procedure including bleeding, infection, hepatobiliary injury, and the possibility of an open procedure were fully explained to the patient, who gave informed consent.  Procedure note: The patient was placed in the supine position.  After induction of general endotracheal anesthesia, the abdomen was prepped and draped using usual sterile technique with ChloraPrep.  Surgical site confirmation was performed.  An infraumbilical incision was made down to the fascia.  A Veress needle was introduced into the abdominal cavity and confirmation of placement was done using the saline drop test.  The abdomen was then insufflated with 15 mmHg pressure.  An 8 mm trocar was introduced into the abdominal cavity under direct visualization without difficulty.  The patient was placed in reverse Trendelenburg position and an additional 12 mm trocar was placed in the left upper quadrant region and an 8 mm trocar was placed in the left lower quadrant region and left flank regions.  The gallbladder was noted to be distended with a thickened gallbladder wall which was inflamed.  It was retracted in a dynamic fashion in order to provide a critical view of the triangle of Calot.  The cystic duct was first identified.  Its juncture to the infundibulum was fully identified.  This was confirmed by firefly.  Hem-o-lok clips were placed proximally and distally on the cystic duct and the cystic duct was divided.  This was likewise done to the cystic artery.  The gallbladder was freed away from the gallbladder fossa using Bovie  electrocautery.  Surgicel powder was placed in the gallbladder fossa after was irrigated.  No abnormal bleeding or bile leakage was noted.  The gallbladder was removed using an Endo Catch bag without difficulty.  The robot was then undocked and all air was evacuated from the abdominal cavity prior to removal of the trocars.  All wounds were irrigated with normal saline.  All wounds were injected with 0.5% Sensorcaine.  The left upper quadrant fascia trocar site was closed using an 0 Vicryl interrupted suture.  All skin incisions were closed using a 4-0 Monocryl subcuticular suture.  Dermabond was applied.  All tape and needle counts were correct at the end of the procedure.  The patient was extubated in the operating room and transferred to PACU in stable condition.  Complications: None  EBL: Minimal  Specimen: Gallbladder

## 2022-08-22 NOTE — Progress Notes (Signed)
Patient continues with pain during the night in his abdomen, PRN pain meds given. NPO since midnight. Consent in chart. PCR swab done. Results in chart. CHG given this AM. SCDs in room. IV fluids continuing.

## 2022-08-22 NOTE — Interval H&P Note (Signed)
**Note Luke-Identified via Obfuscation** History and Physical Interval Note:  08/22/2022 3:11 PM  Luke Dennis  has presented today for surgery, with the diagnosis of acute cholecystitis, cholelithiasis.  The various methods of treatment have been discussed with the patient and family. After consideration of risks, benefits and other options for treatment, the patient has consented to  Procedure(s): XI ROBOTIC ASSISTED LAPAROSCOPIC CHOLECYSTECTOMY (N/A) as a surgical intervention.  The patient's history has been reviewed, patient examined, no change in status, stable for surgery.  I have reviewed the patient's chart and labs.  Questions were answered to the patient's satisfaction.     Franky Macho

## 2022-08-22 NOTE — Transfer of Care (Signed)
Immediate Anesthesia Transfer of Care Note  Patient: Luke Dennis  Procedure(s) Performed: XI ROBOTIC ASSISTED LAPAROSCOPIC CHOLECYSTECTOMY (Abdomen)  Patient Location: PACU  Anesthesia Type:General  Level of Consciousness: awake, sedated, drowsy, and patient cooperative  Airway & Oxygen Therapy: Patient Spontanous Breathing and Patient connected to nasal cannula oxygen  Post-op Assessment: Report given to RN and Post -op Vital signs reviewed and stable  Post vital signs: Reviewed and stable  Last Vitals:  Vitals Value Taken Time  BP 145/86 08/22/22 1727  Temp 37.1 C 08/22/22 1727  Pulse 85 08/22/22 1729  Resp 15 08/22/22 1729  SpO2 99 % 08/22/22 1729  Vitals shown include unvalidated device data.  Last Pain:  Vitals:   08/22/22 1336  TempSrc: Oral  PainSc: 3          Complications: No notable events documented.

## 2022-08-22 NOTE — Anesthesia Procedure Notes (Signed)
Procedure Name: Intubation Date/Time: 08/22/2022 3:55 PM  Performed by: Lorin Glass, CRNAPre-anesthesia Checklist: Patient identified, Emergency Drugs available, Suction available and Patient being monitored Patient Re-evaluated:Patient Re-evaluated prior to induction Oxygen Delivery Method: Circle system utilized Preoxygenation: Pre-oxygenation with 100% oxygen Induction Type: IV induction Ventilation: Mask ventilation without difficulty Laryngoscope Size: Mac and 4 Grade View: Grade I Tube type: Oral Tube size: 8.0 mm Number of attempts: 1 Airway Equipment and Method: Stylet and Oral airway Placement Confirmation: ETT inserted through vocal cords under direct vision, positive ETCO2 and breath sounds checked- equal and bilateral Secured at: 23 cm Tube secured with: Tape Dental Injury: Teeth and Oropharynx as per pre-operative assessment

## 2022-08-22 NOTE — Anesthesia Postprocedure Evaluation (Signed)
Anesthesia Post Note  Patient: Luke Dennis  Procedure(s) Performed: XI ROBOTIC ASSISTED LAPAROSCOPIC CHOLECYSTECTOMY (Abdomen)  Patient location during evaluation: PACU Anesthesia Type: General Level of consciousness: awake and alert and oriented Pain management: pain level controlled Vital Signs Assessment: post-procedure vital signs reviewed and stable Respiratory status: spontaneous breathing, nonlabored ventilation and respiratory function stable Cardiovascular status: blood pressure returned to baseline and stable Postop Assessment: no apparent nausea or vomiting Anesthetic complications: no  No notable events documented.   Last Vitals:  Vitals:   08/22/22 1753 08/22/22 1800  BP:  135/80  Pulse: 90 89  Resp: 16 14  Temp:    SpO2: 96% 96%    Last Pain:  Vitals:   08/22/22 1800  TempSrc:   PainSc: 5                  Valery Chance C Saahir Prude

## 2022-08-22 NOTE — Progress Notes (Signed)
  Progress Note   Patient: Luke Dennis WUJ:[id number] DOB: 05/08/1985 DOA: 08/20/2022     2 DOS: the patient was seen and examined on 08/22/2022   Brief hospital admission narrative: [age] year old male without past medical history outside from tobacco abuse and prior history of drug abuse; who presented to the hospital secondary to abdominal pain.  Demonstrating with gallstone in the neck suggestive for acute cholecystitis.  Patient with elevated WBCs transaminitis/bilirubin elevation.  General surgery primary service.  Assessment and Plan: 1-acute cholecystitis -Continue fluid resuscitation, analgesics, antiemetics and current antibiotics. -MRCP demonstrating cholelithiasis with cholecystitis; no CBD abnormalities or obstruction seen. -Plan is for cholecystectomy later today.  2-tobacco abuse -Cessation counseling provided -Continue nicotine patch. -Patient was receptive and looking to quit smoking.  3-elevated blood pressure -No prior history of hypertension. -ongoing pain is likely responsible for elevated BP -Lopressor and as needed hydralazine has been started -Continue analgesics -Heart healthy/low-sodium diet discussed with patient. -Ambulatory blood pressure checkup recommended prior to initiating antihypertensive regimen. -Follow-up vital signs.  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  5-overweight -Body mass index is 29.8 kg/m. -Low-calorie diet, portion control and increase physical activity discussed with patient.  Internal medicine service will remain available if needed; at this moment primary care to continue treatment of presenting main issue (acute cholecystitis process).  Subjective:  Patient continued complaining of right upper quadrant and expressed decreased appetite.  No fever, no chest pain, no shortness of breath, currently denies nausea or  vomiting.  Physical Exam: Vitals:   08/21/22 2100 08/21/22 2354 08/22/22 0346 08/22/22 1220  BP: (!) 155/107 (!) 144/95 (!) 146/98 (!) 147/91  Pulse: 76 72 65 73  Resp: 16   20  Temp: 98.3 F (36.8 C)   98.3 F (36.8 C)  TempSrc: Oral   Oral  SpO2: 100%   100%  Weight:      Height:       General exam: Alert, awake, oriented x 3; reports no nausea or vomiting.  Continues to have right upper quadrant abdominal pain. Respiratory system: Clear to auscultation. Respiratory effort normal.  Good saturation on room air. Cardiovascular system:RRR. No rubs or gallops.  No JVD. Gastrointestinal system: Abdomen is nondistended, soft on palpation and with tenderness while touching midepigastric and right upper quadrant area.  Positive bowel sounds appreciated. Central nervous system: Alert and oriented. No focal neurological deficits. Extremities: No cyanosis or clubbing. Skin: No petechiae. Psychiatry: Judgement and insight appear normal. Mood & affect appropriate.    Data Reviewed: Comprehensive metabolic panel: Sodium 136, potassium 3.6, chloride 102, bicarb 26, BUN 13, creatinine 0.85, albumin 3.5, ALT 82, alkaline phosphatase 88 and total bilirubin 2.3. CBC: WBCs 9.7, hemoglobin 14.1 and platelet count 154 K   Family Communication: mother at bedside.  Disposition: Status is: Inpatient Remains inpatient appropriate because: continue IV antibiotics and follow further recommendations/surgical intervention for ongoing acute cholecystitis process.   Planned Discharge Destination: Home  Time spent: 35 minutes  Author: Vassie Loll, MD 08/22/2022 1:03 PM  For on call review [email or web address].

## 2022-08-22 NOTE — Anesthesia Preprocedure Evaluation (Signed)
Anesthesia Evaluation  Patient identified by MRN, date of birth, ID band Patient awake    Reviewed: Allergy & Precautions, H&P , NPO status , Patient's Chart, lab work & pertinent test results  Airway Mallampati: III  TM Distance: >3 FB Neck ROM: Full    Dental  (+) Teeth Intact, Dental Advisory Given   Pulmonary Current Smoker and Patient abstained from smoking.   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 drug use)  marijuana use and methamphetamine use  Endo/Other  negative endocrine ROS    Renal/GU negative Renal ROS  negative genitourinary   Musculoskeletal negative musculoskeletal ROS (+)    Abdominal   Peds negative pediatric ROS (+)  Hematology negative hematology ROS (+)   Anesthesia Other Findings   Reproductive/Obstetrics negative OB ROS                             Anesthesia Physical Anesthesia Plan  ASA: 2  Anesthesia Plan: General   Post-op Pain Management: Dilaudid IV   Induction: Intravenous, Rapid sequence and Cricoid pressure planned  PONV Risk Score and Plan: 3 and Ondansetron, Dexamethasone and Midazolam  Airway Management Planned: Oral ETT  Additional Equipment:   Intra-op Plan:   Post-operative Plan: Extubation in OR  Informed Consent: I have reviewed the patients History and Physical, chart, labs and discussed the procedure including the risks, benefits and alternatives for the proposed anesthesia with the patient or authorized representative who has indicated his/her understanding and acceptance.     Dental advisory given  Plan Discussed with: CRNA and Surgeon  Anesthesia  Plan Comments:         Anesthesia Quick Evaluation

## 2022-08-23 LAB — COMPREHENSIVE METABOLIC PANEL
ALT: 97 U/L — ABNORMAL HIGH (ref 0–44)
AST: 38 U/L (ref 15–41)
Albumin: 3.8 g/dL (ref 3.5–5.0)
Alkaline Phosphatase: 120 U/L (ref 38–126)
Anion gap: 9 (ref 5–15)
BUN: 15 mg/dL (ref 6–20)
CO2: 26 mmol/L (ref 22–32)
Calcium: 8.9 mg/dL (ref 8.9–10.3)
Chloride: 100 mmol/L (ref 98–111)
Creatinine, Ser: 0.85 mg/dL (ref 0.61–1.24)
GFR, Estimated: >60 mL/min (ref >60)
Glucose, Bld: 136 mg/dL — ABNORMAL HIGH (ref 70–99)
Potassium: 4.4 mmol/L (ref 3.5–5.1)
Sodium: 135 mmol/L (ref 135–145)
Total Bilirubin: 1.2 mg/dL (ref 0.3–1.2)
Total Protein: 7.4 g/dL (ref 6.5–8.1)

## 2022-08-23 LAB — CBC
HCT: 44.8 % (ref 39.0–52.0)
Hemoglobin: 15.3 g/dL (ref 13.0–17.0)
MCH: 31.5 pg (ref 26.0–34.0)
MCHC: 34.2 g/dL (ref 30.0–36.0)
MCV: 92.2 fL (ref 80.0–100.0)
Platelets: 187 10*3/uL (ref 150–400)
RBC: 4.86 MIL/uL (ref 4.22–5.81)
RDW: 11.9 % (ref 11.5–15.5)
WBC: 17.5 10*3/uL — ABNORMAL HIGH (ref 4.0–10.5)
nRBC: 0 % (ref 0.0–0.2)

## 2022-08-23 NOTE — Discharge Summary (Signed)
Physician Discharge Summary  Patient ID: Luke Dennis MRN: [id number] DOB/AGE: 10/07/1985 37 y.o.  Admit date: 08/20/2022 Discharge date: 08/23/2022  Admission Diagnoses: Acute cholecystitis, cholelithiasis  Discharge Diagnoses: Same Principal Problem:   Acute cholecystitis Active Problems:   Tobacco abuse   Discharged Condition: good  Hospital Course: Patient is a 37 year old white male who presented to the emergency room on 08/20/2022 with worsening right upper quadrant abdominal pain.  CT scan of the abdomen revealed acute appendicitis.  He was also noted to have mild transaminitis.  An MRCP was performed on 04-Sep-2022 which was negative for choledocholithiasis.  The patient subsequently underwent a robotic assisted laparoscopic cholecystectomy on 09-04-22.  He tolerated the surgery well.  His postoperative course has been unremarkable.  His diet was advanced out difficulty.  He did have an elevated white blood cell count which was secondary to his surgery.  He is being discharged home on 08/23/2022 in good and improving condition.    Significant Diagnostic Studies: radiology: MRI: MRCP on Sep 04, 2022  Treatments: surgery: Robotic assisted laparoscopic cholecystectomy on 04-Sep-2022  Discharge Exam: Blood pressure 126/80, pulse 79, temperature 98.7 F (37.1 C), temperature source Oral, resp. rate 20, height 6' (1.829 m), weight 99.7 kg, SpO2 98 %. General appearance: alert, cooperative, and no distress Resp: clear to auscultation bilaterally Cardio: regular rate and rhythm, S1, S2 normal, no murmur, click, rub or gallop GI: Soft, incisions healing well.  Disposition: Discharge disposition: 01-Home or Self Care       Discharge Instructions     Diet - low sodium heart healthy   Complete by: As directed    Increase activity slowly   Complete by: As directed    Increase activity slowly   Complete by: As directed       Allergies as of 08/23/2022       Reactions   Haldol  [haloperidol Lactate] Other (See Comments)   lock jaw         Medication List     STOP taking these medications    GOODY HEADACHE PO       TAKE these medications    calcium carbonate 500 MG chewable tablet Commonly known as: TUMS - dosed in mg elemental calcium Chew 1 tablet by mouth once as needed for indigestion or heartburn.   famotidine 20 MG tablet Commonly known as: PEPCID Take 20 mg by mouth daily as needed for heartburn or indigestion.   HYDROcodone-acetaminophen 5-325 MG tablet Commonly known as: Norco Take 1 tablet by mouth every 4 (four) hours as needed for moderate pain.   simethicone 80 MG chewable tablet Commonly known as: MYLICON Chew 80 mg by mouth every 6 (six) hours as needed for flatulence.        Follow-up Information     Franky Macho, MD Follow up.   Specialty: General Surgery Why: As needed.  Will call you next week for follow up. Contact information: [address] [place] [address] [phone]                 Signed: Franky Macho 08/23/2022, 8:32 AM

## 2022-08-24 LAB — SURGICAL PATHOLOGY

## 2022-09-01 ENCOUNTER — Ambulatory Visit: Payer: PRIVATE HEALTH INSURANCE | Admitting: General Surgery

## 2022-09-01 DIAGNOSIS — Z09 Encounter for follow-up examination after completed treatment for conditions other than malignant neoplasm: Secondary | ICD-10-CM

## 2022-09-01 NOTE — Progress Notes (Signed)
Attempted postoperative telephone visit with patient.  Message was left on patient's phone.

## 2024-02-21 ENCOUNTER — Encounter (HOSPITAL_COMMUNITY): Payer: Self-pay

## 2024-02-21 ENCOUNTER — Other Ambulatory Visit: Payer: Self-pay

## 2024-02-21 ENCOUNTER — Emergency Department (HOSPITAL_COMMUNITY)
Admission: EM | Admit: 2024-02-21 | Discharge: 2024-02-21 | Disposition: A | Discharge status: Home / Self Care | Payer: PRIVATE HEALTH INSURANCE | Attending: Emergency Medicine | Admitting: Emergency Medicine

## 2024-02-21 DIAGNOSIS — R110 Nausea: Secondary | ICD-10-CM | POA: Insufficient documentation

## 2024-02-21 DIAGNOSIS — R220 Localized swelling, mass and lump, head: Secondary | ICD-10-CM | POA: Insufficient documentation

## 2024-02-21 LAB — BASIC METABOLIC PANEL WITH GFR
Anion gap: 11 (ref 5–15)
BUN: 14 mg/dL (ref 6–20)
CO2: 27 mmol/L (ref 22–32)
Calcium: 9.7 mg/dL (ref 8.9–10.3)
Chloride: 102 mmol/L (ref 98–111)
Creatinine, Ser: 0.93 mg/dL (ref 0.61–1.24)
GFR, Estimated: >60 mL/min (ref >60)
Glucose, Bld: 85 mg/dL (ref 70–99)
Potassium: 4.7 mmol/L (ref 3.5–5.1)
Sodium: 140 mmol/L (ref 135–145)

## 2024-02-21 LAB — CBC WITH DIFFERENTIAL/PLATELET
Abs Immature Granulocytes: 0.01 K/uL (ref 0.00–0.07)
Basophils Absolute: 0.1 K/uL (ref 0.0–0.1)
Basophils Relative: 1 %
Eosinophils Absolute: 0.3 K/uL (ref 0.0–0.5)
Eosinophils Relative: 2 %
HCT: 52.2 % — ABNORMAL HIGH (ref 39.0–52.0)
Hemoglobin: 17.8 g/dL — ABNORMAL HIGH (ref 13.0–17.0)
Immature Granulocytes: 0 %
Lymphocytes Relative: 30 %
Lymphs Abs: 3.1 K/uL (ref 0.7–4.0)
MCH: 31.7 pg (ref 26.0–34.0)
MCHC: 34.1 g/dL (ref 30.0–36.0)
MCV: 92.9 fL (ref 80.0–100.0)
Monocytes Absolute: 0.8 K/uL (ref 0.1–1.0)
Monocytes Relative: 7 %
Neutro Abs: 6.2 K/uL (ref 1.7–7.7)
Neutrophils Relative %: 60 %
Platelets: 222 K/uL (ref 150–400)
RBC: 5.62 MIL/uL (ref 4.22–5.81)
RDW: 12.3 % (ref 11.5–15.5)
WBC: 10.4 K/uL (ref 4.0–10.5)
nRBC: 0 % (ref 0.0–0.2)

## 2024-02-21 MED ORDER — IBUPROFEN 800 MG PO TABS: 800.0000 mg | ORAL_TABLET | Freq: Three times a day (TID) | ORAL | 0 refills | Status: AC

## 2024-02-21 MED ORDER — KETOROLAC TROMETHAMINE 30 MG/ML IJ SOLN
30.0000 mg | Freq: Once | INTRAMUSCULAR | Status: AC
  Administered 2024-02-21: 30 mg via INTRAVENOUS
  Filled 2024-02-21: qty 1

## 2024-02-21 MED ORDER — CLINDAMYCIN HCL 300 MG PO CAPS: 300.0000 mg | ORAL_CAPSULE | Freq: Three times a day (TID) | ORAL | 0 refills | Status: AC

## 2024-02-21 MED ORDER — OXYCODONE-ACETAMINOPHEN 5-325 MG PO TABS: 1.0000 | ORAL_TABLET | ORAL | 0 refills | Status: AC | PRN

## 2024-02-21 MED ORDER — CLINDAMYCIN PHOSPHATE 600 MG/50ML IV SOLN
600.0000 mg | Freq: Once | INTRAVENOUS | Status: AC
  Administered 2024-02-21: 600 mg via INTRAVENOUS
  Filled 2024-02-21: qty 50

## 2024-02-21 NOTE — ED Triage Notes (Signed)
 Pt arrived via POV c/o abscess to his right face X 2 days. Pt reports trying warm compresses w/o relief and is unsure if it is from an insect bite or an infected hair. Pt reports redness and swelling has increased and endorses nausea.

## 2024-02-21 NOTE — Discharge Instructions (Signed)
°  As discussed, avoid squeezing on your face or sticking pins in the affected area.  Apply warm wet compresses on and off to your face 2-3 times a day.  Start the antibiotic prescription tomorrow.  You have also been prescribed medication to help with the inflammation of the area and pain medication.  Do not operate machinery or drive while taking the pain medication.  Follow-up with your primary care provider for recheck or return to the emergency department if you develop any new or worsening symptoms.

## 2024-02-23 NOTE — ED Provider Notes (Signed)
 [place] EMERGENCY DEPARTMENT AT [facility] Provider Note   CSN: [id number] Arrival date & time: 02/21/24  1013     Patient presents with: Abscess   Luke Dennis is a 38 y.o. male.    Abscess      Luke Dennis is a 38 y.o. male who presents to the Emergency Department complaining of pain and localized swelling of his right cheek area.  Symptoms present for 2 days.  He is unsure, but believes that he was either bitten by an insect or maybe has an infected hair follicle.  He has been squeezing the area and noticed gradually worsening swelling and pain.  States radiating toward his nose and right eye.  He endorses some nausea associated with it.  No fever or chills.  Denies any significant drainage to the area.  He states that he had a boil on his back some years ago but does not have a known history of MRSA.  Denies any difficulty swallowing, dental symptoms, swelling of his lips or tongue.  No visual changes.   Prior to Admission medications  Medication Sig Start Date End Date Taking? Authorizing Provider  clindamycin  (CLEOCIN ) 300 MG capsule Take 1 capsule (300 mg total) by mouth 3 (three) times daily. 02/21/24  Yes Yuma Pacella, PA-C  ibuprofen  (ADVIL ) 800 MG tablet Take 1 tablet (800 mg total) by mouth 3 (three) times daily. Take with food 02/21/24  Yes Celisa Schoenberg, PA-C  lisinopril (ZESTRIL) 20 MG tablet 1 tab(s) orally once a day; Duration: 90 days 01/25/24  Yes [provider]  oxyCODONE -acetaminophen  (PERCOCET/ROXICET) 5-325 MG tablet Take 1 tablet by mouth every 4 (four) hours as needed. 02/21/24  Yes Amil Moseman, PA-C  calcium carbonate (TUMS - DOSED IN MG ELEMENTAL CALCIUM) 500 MG chewable tablet Chew 1 tablet by mouth once as needed for indigestion or heartburn.    [provider]  famotidine (PEPCID) 20 MG tablet Take 20 mg by mouth daily as needed for heartburn or indigestion.    [provider]  omeprazole  (PRILOSEC) 20 MG capsule 1 cap(s) orally once a day; Duration: 30 days    [provider]  simethicone  (MYLICON) 80 MG chewable tablet Chew 80 mg by mouth every 6 (six) hours as needed for flatulence.    [provider]    Allergies: Haldol [haloperidol lactate]    Review of Systems  HENT:  Positive for facial swelling.   All other systems reviewed and are negative.   Updated Vital Signs BP 125/85   Pulse 81   Temp 98.4 F (36.9 C) (Oral)   Resp 16   Ht 6' (1.829 m)   Wt 99.7 kg   SpO2 95%   BMI 29.81 kg/m   Physical Exam Vitals and nursing note reviewed.  Constitutional:      General: He is not in acute distress.    Appearance: He is not toxic-appearing.  HENT:     Head:     Comments:  Localized area of induration approximately 3 centimeters in diameter.  Mild erythema without fluctuance.  Skin appears abraded centrally.  There is no surrounding erythema    Right Ear: Tympanic membrane and ear canal normal.     Left Ear: Tympanic membrane and ear canal normal.     Mouth/Throat:     Mouth: Mucous membranes are moist.     Pharynx: Oropharynx is clear.     Comments: No dental tenderness or obvious dental caries on my exam.  No fluctuance along the right upper or lower gums.  No sublingual abnormality.  Uvula midline nonedematous Eyes:     Extraocular Movements: Extraocular movements intact.     Conjunctiva/sclera: Conjunctivae normal.     Pupils: Pupils are equal, round, and reactive to light.  Cardiovascular:     Rate and Rhythm: Normal rate and regular rhythm.     Pulses: Normal pulses.  Pulmonary:     Effort: Pulmonary effort is normal.  Abdominal:     Palpations: Abdomen is soft.     Tenderness: There is no abdominal tenderness.  Musculoskeletal:        General: Normal range of motion.     Cervical back: Normal range of motion. No rigidity.  Lymphadenopathy:     Cervical: No cervical adenopathy.  Skin:    General: Skin is warm.   Neurological:     General: No focal deficit present.     Mental Status: He is alert.     Sensory: No sensory deficit.     Motor: No weakness.     (all labs ordered are listed, but only abnormal results are displayed) Labs Reviewed  CBC WITH DIFFERENTIAL/PLATELET - Abnormal; Notable for the following components:      Result Value   Hemoglobin 17.8 (*)    HCT 52.2 (*)    All other components within normal limits  BASIC METABOLIC PANEL WITH GFR    EKG: None  Radiology: No results found.   Procedures   Medications Ordered in the ED  ketorolac  (TORADOL ) 30 MG/ML injection 30 mg (30 mg Intravenous Given 02/21/24 1314)  clindamycin  (CLEOCIN ) IVPB 600 mg (0 mg Intravenous Stopped 02/21/24 1434)                                    Medical Decision Making   Patient here for possible boil to right cheek area x 2 days.  Pain and swelling of the area became worse after he squeezed it.  Denies any dental symptoms difficulty swallowing breathing fever or chills.  No visual changes headache or dizziness.  He has had some associated nausea.  No history of diabetes, does endorse a previous boil on his back some years ago but does not have a known history of MRSA  On my exam, symptoms appear to be very localized there is induration with what appears to be a central abrasion to the skin I do not appreciate any fluctuance and there is no drainage at this time  I suspect this may may be developing abscess versus folliculitis.  I suspect the increase in his symptoms are more secondary to trauma to the skin.  I do not feel an obvious drainable abscess at this time.  His vital signs are reassuring and he is nontoxic-appearing.  Do not feel that need for imaging is needed at this time  Patient is agreeable to labs and will give antibiotics here.  Discussed in detail proper treatment which includes avoiding further trauma to the skin, he will continue oral antibiotics and warm compresses to the  area.  I have discussed that if his symptoms worsen or area begins to drain or becomes fluctuant that he may return to the emergency department for I&D  Amount and/or Complexity of Data Reviewed Labs: ordered.    Details: Labs unremarkable  Risk Prescription drug management.        Final diagnoses:  Right facial swelling  ED Discharge Orders          Ordered    oxyCODONE -acetaminophen  (PERCOCET/ROXICET) 5-325 MG tablet  Every 4 hours PRN        02/21/24 1448    clindamycin  (CLEOCIN ) 300 MG capsule  3 times daily        02/21/24 1448    ibuprofen  (ADVIL ) 800 MG tablet  3 times daily        02/21/24 1448               Herlinda Milling, PA-C 02/23/24 1440    Cleotilde Rogue, MD 02/25/24 [phone]
